# Patient Record
Sex: Female | Born: 1970
Health system: Southern US, Community
[De-identification: ages and names within clinical notes are randomized; demographics above are authoritative.]

## PROBLEM LIST (undated history)

## (undated) DIAGNOSIS — M1990 Unspecified osteoarthritis, unspecified site: Secondary | ICD-10-CM

## (undated) DIAGNOSIS — M549 Dorsalgia, unspecified: Secondary | ICD-10-CM

## (undated) DIAGNOSIS — I10 Essential (primary) hypertension: Secondary | ICD-10-CM

## (undated) DIAGNOSIS — B351 Tinea unguium: Secondary | ICD-10-CM

## (undated) HISTORY — DX: Dorsalgia, unspecified: M54.9

## (undated) HISTORY — PX: BUNIONECTOMY: SHX129

## (undated) HISTORY — PX: BACK SURGERY: SHX140

## (undated) HISTORY — DX: Tinea unguium: B35.1

## (undated) HISTORY — PX: TONSILLECTOMY: SUR1361

## (undated) HISTORY — PX: TUBAL LIGATION: SHX77

## (undated) HISTORY — PX: ABDOMINAL HYSTERECTOMY: SHX81

---

## 2012-11-17 ENCOUNTER — Encounter: Payer: Self-pay | Admitting: Podiatrist

## 2012-11-17 ENCOUNTER — Encounter (INDEPENDENT_AMBULATORY_CARE_PROVIDER_SITE_OTHER): Payer: Self-pay

## 2012-11-17 ENCOUNTER — Ambulatory Visit (INDEPENDENT_AMBULATORY_CARE_PROVIDER_SITE_OTHER): Payer: BC Managed Care – PPO | Admitting: Podiatrist

## 2012-11-17 VITALS — BP 147/89 | HR 78 | Resp 18

## 2012-11-17 DIAGNOSIS — B351 Tinea unguium: Secondary | ICD-10-CM

## 2012-11-17 MED ORDER — TERBINAFINE HCL 250 MG PO TABS: 250.0000 mg | ORAL_TABLET | Freq: Every day | ORAL | Status: DC

## 2012-11-17 NOTE — Progress Notes (Signed)
  Subjective:    Patient ID: Tammy [name], female    DOB: 1970/07/13, 42 y.o.   MRN: [id number]  HPI Patient presents today stating "My nails are thick and curling in and the right big toenail is brittle and has a split in it and the left big toenail is thick and curling in"  Patient relates she has had the problem for several years and more recently they have been hurting and discolored.  No treatment has been done on the nails.     Review of Systems  Constitutional: Negative.   HENT: Negative.   Eyes: Negative.   Respiratory: Negative.   Cardiovascular: Negative.   Gastrointestinal: Negative.   Endocrine: Negative.   Genitourinary: Negative.   Musculoskeletal: Positive for back pain and neck pain.  Skin: Negative.   Allergic/Immunologic: Negative.   Hematological:       Swollen lymph node  Psychiatric/Behavioral: Negative.        Objective:   Physical Exam GENERAL APPEARANCE: Alert, conversant. Appropriately groomed. No acute distress.  VASCULAR: Pedal pulses palpable and strong bilateral.  Capillary refill time is immediate to all digits,  Proximal to distal cooling it warm to warm.  Digital hair growth is present bilateral  NEUROLOGIC: sensation is intact epicritically and protectively to 5.07 monofilament at 5/5 sites bilateral.  Light touch is intact bilateral, vibratory sensation intact bilateral, achilles tendon reflex is intact bilateral.  MUSCULOSKELETAL: acceptable muscle strength, tone and stability bilateral.  Intrinsic muscluature intact bilateral.  Rectus appearance of foot and digits noted bilateral.   DERMATOLOGIC: skin color, texture, and turger are within normal limits.  Bilateral hallux nails are thickened and discolored.  The left hallux nail is incurvated on the medial border and has significant subungual debris noted.  Lesser toenails are also affected to a lesser degree.  Incision line from a previous foot (bunion) surgery noted right.      Assessment &  Plan:  Onychomycosis Plan:  Took a sample of the left hallux nail to ensure fungus is due to dermatophyte.  Wrote rx for Lamisil and gave forms for hepatic function panel.  Will notify patient result of the nail sample and blood test.

## 2012-11-17 NOTE — Patient Instructions (Addendum)
Onychomycosis/Fungal Toenails  WHAT IS IT? An infection that lies within the keratin of your nail plate that is caused by a fungus.  WHY ME? Fungal infections affect all ages, sexes, races, and creeds.  There may be many factors that predispose you to a fungal infection such as age, coexisting medical conditions such as diabetes, or an autoimmune disease; stress, medications, fatigue, genetics, etc.  Bottom line: fungus thrives in a warm, moist environment and your shoes offer such a location.  IS IT CONTAGIOUS? Theoretically, yes.  You do not want to share shoes, nail clippers or files with someone who has fungal toenails.  Walking around barefoot in the same room or sleeping in the same bed is unlikely to transfer the organism.  It is important to realize, however, that fungus can spread easily from one nail to the next on the same foot.  HOW DO WE TREAT THIS?  There are several ways to treat this condition.  Treatment may depend on many factors such as age, medications, pregnancy, liver and kidney conditions, etc.  It is best to ask your doctor which options are available to you.  1. No treatment.   Unlike many other medical concerns, you can live with this condition.  However for many people this can be a painful condition and may lead to ingrown toenails or a bacterial infection.  It is recommended that you keep the nails cut short to help reduce the amount of fungal nail. 2. Topical treatment.  These range from herbal remedies to prescription strength nail lacquers.  About 40-50% effective, topicals require twice daily application for approximately 9 to 12 months or until an entirely new nail has grown out.  The most effective topicals are medical grade medications available through physicians offices. 3. Oral antifungal medications.  With an 80-90% cure rate, the most common oral medication requires 3 to 4 months of therapy and stays in your system for a year as the new nail grows out.  Oral  antifungal medications do require blood work to make sure it is a safe drug for you.  A liver function panel will be performed prior to starting the medication and after the first month of treatment.  It is important to have the blood work performed to avoid any harmful side effects.  In general, this medication safe but blood work is required. 4. Laser Therapy.  This treatment is performed by applying a specialized laser to the affected nail plate.  This therapy is noninvasive, fast, and non-painful.  It is not covered by insurance and is therefore, out of pocket.  The results have been acceptable with a 50% cure rate.   [facility] is the only practice in the area to offer this therapy. 5. Permanent Nail Avulsion.  Removing the entire nail so that a new nail will not grow back.  ** We will contact you with the results of your nail culture to ensure you are on the best medication to treat the fungus  [facility] is the best place to get the Rx filled

## 2012-11-18 LAB — HEPATIC FUNCTION PANEL
Albumin: 4.3 g/dL (ref 3.5–5.5)
Alkaline Phosphatase: 67 IU/L (ref 39–117)
Bilirubin, Direct: 0.06 mg/dL (ref 0.00–0.40)
Total Bilirubin: <0.2 mg/dL (ref 0.0–1.2)
Total Protein: 6.6 g/dL (ref 6.0–8.5)

## 2012-11-19 ENCOUNTER — Telehealth: Payer: Self-pay | Admitting: *Deleted

## 2012-11-19 NOTE — Telephone Encounter (Signed)
Spoke to patient informed  her  that her bloodwork was great

## 2012-11-19 NOTE — Telephone Encounter (Signed)
Message copied by Bing Ree on Thu Nov 19, 2012  2:51 PM ------      Message from: Delories Heinz      Created: Wed Nov 18, 2012 10:19 AM       Please notify patient her blood work looks great.       ----- Message -----         From: [facility] Lab Results In Interface         Sent: 11/18/2012   7:46 AM           To: Marlowe Aschoff, DPM                   ------

## 2013-05-03 ENCOUNTER — Telehealth: Payer: Self-pay | Admitting: *Deleted

## 2013-05-03 NOTE — Telephone Encounter (Signed)
I had an appointment back in August.  My toenails have cleared up all except the big toes.  How long should it take the medicine to clear it up?  Should I come back in?  Do I need a refill on the medication?

## 2013-05-10 ENCOUNTER — Telehealth: Payer: Self-pay | Admitting: *Deleted

## 2013-05-10 NOTE — Telephone Encounter (Signed)
I called last week requesting a nurse to call me.  Please call me back at your convenience.

## 2013-05-11 NOTE — Telephone Encounter (Signed)
Called l/m on v/m to call back and schedule appt

## 2013-05-11 NOTE — Telephone Encounter (Signed)
Tried calling pt again still no answer left another voicemail to have her call to schedule an appointment

## 2013-05-12 NOTE — Telephone Encounter (Signed)
Sent message to melody and melody scheduled appt to see doctor/ lisa

## 2013-05-12 NOTE — Telephone Encounter (Signed)
We have tried multiple times to contact patient to schedule appt and no return call from patient yet/ lisa

## 2013-06-01 ENCOUNTER — Encounter: Payer: Self-pay | Admitting: Podiatrist

## 2013-06-01 ENCOUNTER — Ambulatory Visit (INDEPENDENT_AMBULATORY_CARE_PROVIDER_SITE_OTHER): Payer: BC Managed Care – PPO | Admitting: Podiatrist

## 2013-06-01 VITALS — BP 132/81 | HR 68 | Resp 18

## 2013-06-01 DIAGNOSIS — B351 Tinea unguium: Secondary | ICD-10-CM

## 2013-06-01 MED ORDER — ITRACONAZOLE 200 MG PO TABS: 1.0000 | ORAL_TABLET | Freq: Every day | ORAL | Status: DC

## 2013-06-01 NOTE — Patient Instructions (Addendum)
Itraconazole capsules and tablets What is this medicine? ITRACONAZOLE (i tra KO na zole) is an antifungal medicine. It is used to treat certain kinds of fungal or yeast infections. This medicine may be used for other purposes; ask your health care provider or pharmacist if you have questions. COMMON BRAND NAME(S): ONMEL, Sporanox What should I tell my health care provider before I take this medicine? They need to know if you have any of these conditions: -heart disease, including angina or heart failure -history of irregular heartbeat -kidney disease or on dialysis -liver disease -lung disease -an unusual or allergic reaction to itraconazole, or other antifungal medicines, foods, dyes or preservatives -pregnant or trying to get pregnant -breast-feeding How should I use this medicine? Take this medicine by mouth with a full Demirjian of water. Follow the directions on the prescription label. Take after eating a full meal. If you have a condition called achlorhydria, are taking H2-receptor antagonists or other gastric acid suppressors, you should take this medicine with a cola beverage. Take your doses at regular intervals. Do not take your medicine more often than directed. Do not stop taking except on your doctor's advice. Talk to your pediatrician regarding the use of this medicine in children. Special care may be needed. Overdosage: If you think you have taken too much of this medicine contact a poison control center or emergency room at once. NOTE: This medicine is only for you. Do not share this medicine with others. What if I miss a dose? If you miss a dose, take it as soon as you can. If it is almost time for your next dose, take only that dose. Do not take double or extra doses. What may interact with this medicine? Do not take this medicine with any of the following medications: -alfuzosin -cisapride -colchicine -ergot alkaloids like dihydroergotamine, ergonovine, ergotamine,  methylergonovine -methadone -nevirapine -pimozide -red yeast rice -sirolimus -some medicines for anxiety or sleep like alprazolam, clorazepate, flurazepam, midazolam, triazolam -some medicines for blood pressure like felodipine and nisoldipine -some medicines for cancer treatment like irinotecan -some medicines for cholesterol like atorvastatin, cerivastatin, lovastatin, simvastatin -some medicines for the heart like conivaptan, disopyramide, dofetilide, dronedarone, eplerenone, quinidine, ranolazine -some medicines for psychotic disturbances like lurasidone This medicine may also interact with the following medications: -alfentanil -antacids -antiviral medicines for HIV or AIDS -budesonide -buspirone -cilostazol -cyclosporine -digoxin -eletriptan -erythromycin -fentanyl -fluticasone -halofantrine -medicines for blood pressure like amlodipine and nifedipine -medicines for stomach problems like cimetidine, famotidine, omeprazole, lansoprazole -medicines for tuberculosis like isoniazid, INH, rifabutin, rifampin, rifapentine -medicines that treat or prevent blood clots like warfarin -methylprednisolone -other medicines for fungal infections -phenytoin, fosphenytoin -some medicines for diabetes -tacrolimus -trimetrexate This list may not describe all possible interactions. Give your health care provider a list of all the medicines, herbs, non-prescription drugs, or dietary supplements you use. Also tell them if you smoke, drink alcohol, or use illegal drugs. Some items may interact with your medicine. What should I watch for while using this medicine? Visit your doctor or health care professional for check ups. Tell your doctor or healthcare professional if your symptoms do not start to get better or if they get worse. Some fungal infections can take many weeks or months of treatment to cure. Avoid medicines for your stomach like antacids, anticholinergics, and acid blockers for at  least 2 hours after taking this medicine. You may get dizzy or blurred/double vision when taking this medicine. Do not drive, use machinery, or do anything that may be dangerous   until you know how the medicine affects you. Do not stand or sit up quickly, especially if you are an older patient. What side effects may I notice from receiving this medicine? Side effects that you should report to your doctor or health care professional as soon as possible: -allergic reactions like skin rash, itching or hives, swelling of the face, lips, or tongue -breathing problems -changes in hearing -cough up mucus -dark urine -fast, irregular heartbeat -general ill feeling or flu-like symptoms -light-colored stools -loss of appetite -nausea, vomiting -pain, tingling, numbness in the hands or feet -redness, blistering, peeling or loosening of the skin, including inside the mouth -right upper belly pain -sudden weight gain -swelling in feet, ankles, or legs -unusually weak or tired -yellowing of the eyes or skin Side effects that usually do not require medical attention (report to your doctor or health care professional if they continue or are bothersome): -blurred/double vision -diarrhea -dizziness -headache -stomach upset or bloating This list may not describe all possible side effects. Call your doctor for medical advice about side effects. You may report side effects to FDA at 1-800-FDA-1088. Where should I keep my medicine? Keep out of the reach of children. Store at room temperature between 15 and 25 degrees C (59 and 77 degrees F). Protect from light and moisture. Throw away any unused medicine after the expiration date. NOTE: This sheet is a summary. It may not cover all possible information. If you have questions about this medicine, talk to your doctor, pharmacist, or health care provider.  2014, Elsevier/Gold Standard. (2012-07-20 17:02:46)  

## 2013-06-01 NOTE — Progress Notes (Signed)
On my right foot is the big toe and the 2nd toe and the 5th and on the left it is the big toe  Subjective: Patient presents today for followup of Lamisil therapy. She states it cleared most the toenails however her big toe and her second toe on the right and the fifth toe on the left continue to be thick and discolored. She finished the Lamisil as instructed and had no problems or issues with the medication.  Objective: Continued appearance of mycotic nails are noted as stated above. Lamisil was not beneficial for these toenails.  Assessment: Onychomycosis  Plan: Recommended we switch her from Lamisil to onmel. A prescription was written for her to [facility]. At the completion of the medication she will call if it is not offer any clearing of the toenails.

## 2013-06-25 ENCOUNTER — Encounter: Payer: Self-pay | Admitting: Podiatrist

## 2015-07-31 DIAGNOSIS — B351 Tinea unguium: Secondary | ICD-10-CM | POA: Diagnosis not present

## 2015-07-31 DIAGNOSIS — F320 Major depressive disorder, single episode, mild: Secondary | ICD-10-CM | POA: Diagnosis not present

## 2015-07-31 DIAGNOSIS — M545 Low back pain: Secondary | ICD-10-CM | POA: Diagnosis not present

## 2015-08-28 DIAGNOSIS — M47816 Spondylosis without myelopathy or radiculopathy, lumbar region: Secondary | ICD-10-CM | POA: Diagnosis not present

## 2015-08-28 DIAGNOSIS — M5136 Other intervertebral disc degeneration, lumbar region: Secondary | ICD-10-CM | POA: Diagnosis not present

## 2015-09-18 DIAGNOSIS — M545 Low back pain: Secondary | ICD-10-CM | POA: Diagnosis not present

## 2015-10-30 DIAGNOSIS — N3001 Acute cystitis with hematuria: Secondary | ICD-10-CM | POA: Diagnosis not present

## 2015-10-30 DIAGNOSIS — N3000 Acute cystitis without hematuria: Secondary | ICD-10-CM | POA: Diagnosis not present

## 2015-11-13 DIAGNOSIS — N3000 Acute cystitis without hematuria: Secondary | ICD-10-CM | POA: Diagnosis not present

## 2016-02-13 DIAGNOSIS — M545 Low back pain: Secondary | ICD-10-CM | POA: Diagnosis not present

## 2016-02-19 DIAGNOSIS — M545 Low back pain: Secondary | ICD-10-CM | POA: Diagnosis not present

## 2016-02-19 DIAGNOSIS — M5136 Other intervertebral disc degeneration, lumbar region: Secondary | ICD-10-CM | POA: Diagnosis not present

## 2016-02-27 DIAGNOSIS — M545 Low back pain, unspecified: Secondary | ICD-10-CM | POA: Insufficient documentation

## 2016-02-27 DIAGNOSIS — M4126 Other idiopathic scoliosis, lumbar region: Secondary | ICD-10-CM | POA: Diagnosis not present

## 2016-02-27 DIAGNOSIS — M5126 Other intervertebral disc displacement, lumbar region: Secondary | ICD-10-CM

## 2016-02-27 DIAGNOSIS — M4310 Spondylolisthesis, site unspecified: Secondary | ICD-10-CM

## 2016-02-27 DIAGNOSIS — M4120 Other idiopathic scoliosis, site unspecified: Secondary | ICD-10-CM | POA: Insufficient documentation

## 2016-02-27 DIAGNOSIS — M51369 Other intervertebral disc degeneration, lumbar region without mention of lumbar back pain or lower extremity pain: Secondary | ICD-10-CM

## 2016-02-27 DIAGNOSIS — S32009K Unspecified fracture of unspecified lumbar vertebra, subsequent encounter for fracture with nonunion: Secondary | ICD-10-CM | POA: Insufficient documentation

## 2016-02-27 DIAGNOSIS — M5136 Other intervertebral disc degeneration, lumbar region: Secondary | ICD-10-CM | POA: Diagnosis not present

## 2016-02-27 HISTORY — DX: Other intervertebral disc displacement, lumbar region: M51.26

## 2016-02-27 HISTORY — DX: Other intervertebral disc degeneration, lumbar region: M51.36

## 2016-02-27 HISTORY — DX: Spondylolisthesis, site unspecified: M43.10

## 2016-02-27 HISTORY — DX: Low back pain, unspecified: M54.50

## 2016-02-27 HISTORY — DX: Other idiopathic scoliosis, site unspecified: M41.20

## 2016-02-27 HISTORY — DX: Other intervertebral disc degeneration, lumbar region without mention of lumbar back pain or lower extremity pain: M51.369

## 2016-02-29 DIAGNOSIS — Z0000 Encounter for general adult medical examination without abnormal findings: Secondary | ICD-10-CM | POA: Diagnosis not present

## 2016-03-06 DIAGNOSIS — R293 Abnormal posture: Secondary | ICD-10-CM | POA: Diagnosis not present

## 2016-03-06 DIAGNOSIS — M6281 Muscle weakness (generalized): Secondary | ICD-10-CM | POA: Diagnosis not present

## 2016-03-06 DIAGNOSIS — M545 Low back pain: Secondary | ICD-10-CM | POA: Diagnosis not present

## 2016-03-06 DIAGNOSIS — S39012D Strain of muscle, fascia and tendon of lower back, subsequent encounter: Secondary | ICD-10-CM | POA: Diagnosis not present

## 2016-03-08 DIAGNOSIS — M6281 Muscle weakness (generalized): Secondary | ICD-10-CM | POA: Diagnosis not present

## 2016-03-08 DIAGNOSIS — S39012D Strain of muscle, fascia and tendon of lower back, subsequent encounter: Secondary | ICD-10-CM | POA: Diagnosis not present

## 2016-03-08 DIAGNOSIS — R293 Abnormal posture: Secondary | ICD-10-CM | POA: Diagnosis not present

## 2016-03-08 DIAGNOSIS — M545 Low back pain: Secondary | ICD-10-CM | POA: Diagnosis not present

## 2016-03-11 DIAGNOSIS — R293 Abnormal posture: Secondary | ICD-10-CM | POA: Diagnosis not present

## 2016-03-11 DIAGNOSIS — M6281 Muscle weakness (generalized): Secondary | ICD-10-CM | POA: Diagnosis not present

## 2016-03-11 DIAGNOSIS — M545 Low back pain: Secondary | ICD-10-CM | POA: Diagnosis not present

## 2016-03-11 DIAGNOSIS — S39012D Strain of muscle, fascia and tendon of lower back, subsequent encounter: Secondary | ICD-10-CM | POA: Diagnosis not present

## 2016-03-21 DIAGNOSIS — M6281 Muscle weakness (generalized): Secondary | ICD-10-CM | POA: Diagnosis not present

## 2016-03-21 DIAGNOSIS — S39012D Strain of muscle, fascia and tendon of lower back, subsequent encounter: Secondary | ICD-10-CM | POA: Diagnosis not present

## 2016-03-21 DIAGNOSIS — M545 Low back pain: Secondary | ICD-10-CM | POA: Diagnosis not present

## 2016-03-21 DIAGNOSIS — R293 Abnormal posture: Secondary | ICD-10-CM | POA: Diagnosis not present

## 2016-03-26 DIAGNOSIS — I10 Essential (primary) hypertension: Secondary | ICD-10-CM | POA: Diagnosis not present

## 2016-03-26 DIAGNOSIS — M4126 Other idiopathic scoliosis, lumbar region: Secondary | ICD-10-CM | POA: Diagnosis not present

## 2016-03-26 DIAGNOSIS — M4310 Spondylolisthesis, site unspecified: Secondary | ICD-10-CM | POA: Diagnosis not present

## 2016-03-27 DIAGNOSIS — R293 Abnormal posture: Secondary | ICD-10-CM | POA: Diagnosis not present

## 2016-03-27 DIAGNOSIS — M545 Low back pain: Secondary | ICD-10-CM | POA: Diagnosis not present

## 2016-03-27 DIAGNOSIS — S39012D Strain of muscle, fascia and tendon of lower back, subsequent encounter: Secondary | ICD-10-CM | POA: Diagnosis not present

## 2016-03-27 DIAGNOSIS — M6281 Muscle weakness (generalized): Secondary | ICD-10-CM | POA: Diagnosis not present

## 2016-04-02 DIAGNOSIS — M545 Low back pain: Secondary | ICD-10-CM | POA: Diagnosis not present

## 2016-04-02 DIAGNOSIS — M6281 Muscle weakness (generalized): Secondary | ICD-10-CM | POA: Diagnosis not present

## 2016-04-02 DIAGNOSIS — S39012D Strain of muscle, fascia and tendon of lower back, subsequent encounter: Secondary | ICD-10-CM | POA: Diagnosis not present

## 2016-04-02 DIAGNOSIS — R293 Abnormal posture: Secondary | ICD-10-CM | POA: Diagnosis not present

## 2016-04-08 DIAGNOSIS — S39012D Strain of muscle, fascia and tendon of lower back, subsequent encounter: Secondary | ICD-10-CM | POA: Diagnosis not present

## 2016-04-08 DIAGNOSIS — M6281 Muscle weakness (generalized): Secondary | ICD-10-CM | POA: Diagnosis not present

## 2016-04-08 DIAGNOSIS — M545 Low back pain: Secondary | ICD-10-CM | POA: Diagnosis not present

## 2016-04-08 DIAGNOSIS — R293 Abnormal posture: Secondary | ICD-10-CM | POA: Diagnosis not present

## 2016-04-15 DIAGNOSIS — R293 Abnormal posture: Secondary | ICD-10-CM | POA: Diagnosis not present

## 2016-04-15 DIAGNOSIS — S39012D Strain of muscle, fascia and tendon of lower back, subsequent encounter: Secondary | ICD-10-CM | POA: Diagnosis not present

## 2016-04-15 DIAGNOSIS — M6281 Muscle weakness (generalized): Secondary | ICD-10-CM | POA: Diagnosis not present

## 2016-04-15 DIAGNOSIS — M545 Low back pain: Secondary | ICD-10-CM | POA: Diagnosis not present

## 2016-04-18 DIAGNOSIS — M6281 Muscle weakness (generalized): Secondary | ICD-10-CM | POA: Diagnosis not present

## 2016-04-18 DIAGNOSIS — M545 Low back pain: Secondary | ICD-10-CM | POA: Diagnosis not present

## 2016-04-18 DIAGNOSIS — R293 Abnormal posture: Secondary | ICD-10-CM | POA: Diagnosis not present

## 2016-04-18 DIAGNOSIS — S39012D Strain of muscle, fascia and tendon of lower back, subsequent encounter: Secondary | ICD-10-CM | POA: Diagnosis not present

## 2016-04-22 DIAGNOSIS — N3001 Acute cystitis with hematuria: Secondary | ICD-10-CM | POA: Diagnosis not present

## 2016-04-22 DIAGNOSIS — N3091 Cystitis, unspecified with hematuria: Secondary | ICD-10-CM | POA: Diagnosis not present

## 2016-05-07 ENCOUNTER — Other Ambulatory Visit: Payer: Self-pay | Admitting: Neurological Surgery

## 2016-05-27 DIAGNOSIS — Z1231 Encounter for screening mammogram for malignant neoplasm of breast: Secondary | ICD-10-CM | POA: Diagnosis not present

## 2016-06-03 DIAGNOSIS — L255 Unspecified contact dermatitis due to plants, except food: Secondary | ICD-10-CM | POA: Diagnosis not present

## 2016-06-27 ENCOUNTER — Encounter (HOSPITAL_COMMUNITY): Payer: Self-pay

## 2016-06-27 ENCOUNTER — Ambulatory Visit (HOSPITAL_COMMUNITY)
Admission: RE | Admit: 2016-06-27 | Discharge: 2016-06-27 | Disposition: A | Discharge status: Home / Self Care | Payer: BLUE CROSS/BLUE SHIELD | Source: Ambulatory Visit | Attending: Neurological Surgery | Admitting: Neurological Surgery

## 2016-06-27 ENCOUNTER — Encounter (HOSPITAL_COMMUNITY)
Admission: RE | Admit: 2016-06-27 | Discharge: 2016-06-27 | Disposition: A | Discharge status: Home / Self Care | Payer: BLUE CROSS/BLUE SHIELD | Source: Ambulatory Visit | Attending: Neurological Surgery | Admitting: Neurological Surgery

## 2016-06-27 DIAGNOSIS — M419 Scoliosis, unspecified: Secondary | ICD-10-CM | POA: Insufficient documentation

## 2016-06-27 DIAGNOSIS — Z0183 Encounter for blood typing: Secondary | ICD-10-CM | POA: Insufficient documentation

## 2016-06-27 DIAGNOSIS — Z01812 Encounter for preprocedural laboratory examination: Secondary | ICD-10-CM | POA: Diagnosis not present

## 2016-06-27 DIAGNOSIS — Z01818 Encounter for other preprocedural examination: Secondary | ICD-10-CM | POA: Diagnosis not present

## 2016-06-27 HISTORY — DX: Unspecified osteoarthritis, unspecified site: M19.90

## 2016-06-27 LAB — CBC WITH DIFFERENTIAL/PLATELET
BASOS ABS: 0 10*3/uL (ref 0.0–0.1)
Basophils Relative: 0 %
EOS PCT: 1 %
Eosinophils Absolute: 0.2 10*3/uL (ref 0.0–0.7)
HEMATOCRIT: 43.9 % (ref 36.0–46.0)
HEMOGLOBIN: 14.5 g/dL (ref 12.0–15.0)
LYMPHS ABS: 2.9 10*3/uL (ref 0.7–4.0)
LYMPHS PCT: 26 %
MCH: 31.6 pg (ref 26.0–34.0)
MCHC: 33 g/dL (ref 30.0–36.0)
MCV: 95.6 fL (ref 78.0–100.0)
Monocytes Absolute: 0.7 10*3/uL (ref 0.1–1.0)
Monocytes Relative: 6 %
NEUTROS ABS: 7.2 10*3/uL (ref 1.7–7.7)
NEUTROS PCT: 67 %
PLATELETS: 260 10*3/uL (ref 150–400)
RBC: 4.59 MIL/uL (ref 3.87–5.11)
RDW: 13.7 % (ref 11.5–15.5)
WBC: 10.9 10*3/uL — AB (ref 4.0–10.5)

## 2016-06-27 LAB — SURGICAL PCR SCREEN
MRSA, PCR: NEGATIVE
STAPHYLOCOCCUS AUREUS: NEGATIVE

## 2016-06-27 LAB — BASIC METABOLIC PANEL
ANION GAP: 9 (ref 5–15)
BUN: 10 mg/dL (ref 6–20)
CHLORIDE: 103 mmol/L (ref 101–111)
CO2: 26 mmol/L (ref 22–32)
Calcium: 9.4 mg/dL (ref 8.9–10.3)
Creatinine, Ser: 0.81 mg/dL (ref 0.44–1.00)
Glucose, Bld: 78 mg/dL (ref 65–99)
POTASSIUM: 4.1 mmol/L (ref 3.5–5.1)
SODIUM: 138 mmol/L (ref 135–145)

## 2016-06-27 LAB — TYPE AND SCREEN
ABO/RH(D): O POS
ANTIBODY SCREEN: NEGATIVE

## 2016-06-27 LAB — PROTIME-INR
INR: 0.92
Prothrombin Time: 12.3 seconds (ref 11.4–15.2)

## 2016-06-27 LAB — ABO/RH: ABO/RH(D): O POS

## 2016-06-27 NOTE — Pre-Procedure Instructions (Signed)
    Germaine Pomfreticole Hannon  06/27/2016      [id number] - [place], [place] - [address] [facility] [place] [id number] Phone: [phone] Fax: [phone]  [facility] [place] - [address] [place] [id number] Phone: [phone] Fax: [phone]    Your procedure is scheduled on June 20th, Wednesday.   [facility] 7:45 AM.             (posted surgery time 9:45 am - 2:45 pm)   Call this number if you have problems the morning of surgery:  [phone], any other questions [phone] Mon-Fri from 8-4:30pm   Remember:  Do not eat food or drink liquids after midnight Tuesday.              4-5 days prior to surgery, STOP TAKING any vitamins, anti-inflammatories, herbal supplements.   Take these medicines the morning of surgery with A SIP OF WATER : Flexeril, Oxycodone.   Do not wear jewelry, make-up or nail polish.  Do not wear lotions, powders,  perfumes, or deoderant.  Do not shave 48 hours prior to surgery.     Do not bring valuables to the hospital.  [facility] is not responsible for any belongings or valuables.  Contacts, dentures or bridgework may not be worn into surgery.  Leave your suitcase in the car.  After surgery it may be brought to your room.  For patients admitted to the hospital, discharge time will be determined by your treatment team.   Please read over the following fact sheets that you were given. Pain Booklet, MRSA Information and Surgical Site Infection Prevention

## 2016-06-27 NOTE — Progress Notes (Addendum)
PCP is Gerre PebblesSally Davis, NP or PA with [facility] [place].  LOV 01/2016 Denies any cardiac issues or tests.

## 2016-07-09 MED ORDER — CEFAZOLIN SODIUM-DEXTROSE 2-4 GM/100ML-% IV SOLN
2.0000 g | INTRAVENOUS | Status: AC
  Administered 2016-07-10 (×2): 2 g via INTRAVENOUS
  Filled 2016-07-09: qty 100

## 2016-07-09 MED ORDER — DEXAMETHASONE SODIUM PHOSPHATE 10 MG/ML IJ SOLN
10.0000 mg | INTRAMUSCULAR | Status: AC
  Administered 2016-07-10: 10 mg via INTRAVENOUS
  Filled 2016-07-09: qty 1

## 2016-07-10 ENCOUNTER — Inpatient Hospital Stay (HOSPITAL_COMMUNITY)
Admission: RE | Admit: 2016-07-10 | Discharge: 2016-07-11 | DRG: 455 | Disposition: A | Discharge status: Home / Self Care | Payer: BLUE CROSS/BLUE SHIELD | Source: Ambulatory Visit | Attending: Neurological Surgery | Admitting: Neurological Surgery

## 2016-07-10 ENCOUNTER — Encounter (HOSPITAL_COMMUNITY)
Admission: RE | Disposition: A | Discharge status: Home / Self Care | Payer: Self-pay | Source: Ambulatory Visit | Attending: Neurological Surgery

## 2016-07-10 ENCOUNTER — Inpatient Hospital Stay (HOSPITAL_COMMUNITY): Payer: BLUE CROSS/BLUE SHIELD | Admitting: Certified Registered"

## 2016-07-10 ENCOUNTER — Encounter (HOSPITAL_COMMUNITY): Payer: Self-pay | Admitting: Anesthesiology

## 2016-07-10 ENCOUNTER — Inpatient Hospital Stay (HOSPITAL_COMMUNITY): Payer: BLUE CROSS/BLUE SHIELD

## 2016-07-10 DIAGNOSIS — Z981 Arthrodesis status: Secondary | ICD-10-CM

## 2016-07-10 DIAGNOSIS — M48061 Spinal stenosis, lumbar region without neurogenic claudication: Secondary | ICD-10-CM | POA: Diagnosis present

## 2016-07-10 DIAGNOSIS — Z419 Encounter for procedure for purposes other than remedying health state, unspecified: Secondary | ICD-10-CM

## 2016-07-10 DIAGNOSIS — M47816 Spondylosis without myelopathy or radiculopathy, lumbar region: Principal | ICD-10-CM | POA: Diagnosis present

## 2016-07-10 DIAGNOSIS — F17210 Nicotine dependence, cigarettes, uncomplicated: Secondary | ICD-10-CM | POA: Diagnosis present

## 2016-07-10 DIAGNOSIS — M1990 Unspecified osteoarthritis, unspecified site: Secondary | ICD-10-CM | POA: Diagnosis present

## 2016-07-10 DIAGNOSIS — M419 Scoliosis, unspecified: Secondary | ICD-10-CM | POA: Diagnosis not present

## 2016-07-10 DIAGNOSIS — M545 Low back pain: Secondary | ICD-10-CM | POA: Diagnosis not present

## 2016-07-10 DIAGNOSIS — Z90710 Acquired absence of both cervix and uterus: Secondary | ICD-10-CM | POA: Diagnosis not present

## 2016-07-10 DIAGNOSIS — M4316 Spondylolisthesis, lumbar region: Secondary | ICD-10-CM | POA: Diagnosis not present

## 2016-07-10 DIAGNOSIS — M4326 Fusion of spine, lumbar region: Secondary | ICD-10-CM | POA: Diagnosis not present

## 2016-07-10 HISTORY — PX: MAXIMUM ACCESS (MAS)POSTERIOR LUMBAR INTERBODY FUSION (PLIF) 3 LEVEL: SHX6370

## 2016-07-10 HISTORY — DX: Arthrodesis status: Z98.1

## 2016-07-10 SURGERY — FOR MAXIMUM ACCESS (MAS) POSTERIOR LUMBAR INTERBODY FUSION (PLIF) 3 LEVEL
Anesthesia: General | Site: Back

## 2016-07-10 MED ORDER — FENTANYL CITRATE (PF) 100 MCG/2ML IJ SOLN
INTRAMUSCULAR | Status: DC | PRN
  Administered 2016-07-10 (×3): 50 ug via INTRAVENOUS
  Administered 2016-07-10: 150 ug via INTRAVENOUS
  Administered 2016-07-10 (×2): 50 ug via INTRAVENOUS
  Administered 2016-07-10: 100 ug via INTRAVENOUS

## 2016-07-10 MED ORDER — MEPERIDINE HCL 25 MG/ML IJ SOLN: 6.2500 mg | INTRAMUSCULAR | Status: DC | PRN

## 2016-07-10 MED ORDER — PHENOL 1.4 % MT LIQD: 1.0000 | OROMUCOSAL | Status: DC | PRN

## 2016-07-10 MED ORDER — THROMBIN 20000 UNITS EX SOLR
CUTANEOUS | Status: AC
  Filled 2016-07-10: qty 20000

## 2016-07-10 MED ORDER — ACETAMINOPHEN 325 MG PO TABS
ORAL_TABLET | ORAL | Status: AC
  Administered 2016-07-10: 650 mg via ORAL
  Filled 2016-07-10: qty 2

## 2016-07-10 MED ORDER — THROMBIN 5000 UNITS EX SOLR
CUTANEOUS | Status: AC
  Filled 2016-07-10: qty 5000

## 2016-07-10 MED ORDER — MORPHINE SULFATE (PF) 2 MG/ML IV SOLN: 2.0000 mg | INTRAVENOUS | Status: DC | PRN

## 2016-07-10 MED ORDER — MIDAZOLAM HCL 2 MG/2ML IJ SOLN
INTRAMUSCULAR | Status: AC
  Filled 2016-07-10: qty 2

## 2016-07-10 MED ORDER — LACTATED RINGERS IV SOLN
INTRAVENOUS | Status: DC | PRN
  Administered 2016-07-10 (×3): via INTRAVENOUS

## 2016-07-10 MED ORDER — OXYCODONE HCL 5 MG PO TABS
ORAL_TABLET | ORAL | Status: AC
Start: 2016-07-10 — End: 2016-07-10
  Administered 2016-07-10: 10 mg via ORAL
  Filled 2016-07-10: qty 2

## 2016-07-10 MED ORDER — BUPIVACAINE HCL (PF) 0.25 % IJ SOLN
INTRAMUSCULAR | Status: AC
  Filled 2016-07-10: qty 30

## 2016-07-10 MED ORDER — OXYCODONE HCL 5 MG PO TABS
5.0000 mg | ORAL_TABLET | ORAL | Status: DC | PRN
  Administered 2016-07-10 – 2016-07-11 (×4): 10 mg via ORAL
  Filled 2016-07-10 (×4): qty 2

## 2016-07-10 MED ORDER — PROPOFOL 10 MG/ML IV BOLUS
INTRAVENOUS | Status: AC
  Filled 2016-07-10: qty 20

## 2016-07-10 MED ORDER — KETOROLAC TROMETHAMINE 30 MG/ML IJ SOLN
INTRAMUSCULAR | Status: DC | PRN
  Administered 2016-07-10: 30 mg via INTRAVENOUS

## 2016-07-10 MED ORDER — PROMETHAZINE HCL 25 MG/ML IJ SOLN: 6.2500 mg | INTRAMUSCULAR | Status: DC | PRN

## 2016-07-10 MED ORDER — SUCCINYLCHOLINE CHLORIDE 200 MG/10ML IV SOSY
PREFILLED_SYRINGE | INTRAVENOUS | Status: DC | PRN
  Administered 2016-07-10: 120 mg via INTRAVENOUS

## 2016-07-10 MED ORDER — CHLORHEXIDINE GLUCONATE CLOTH 2 % EX PADS: 6.0000 | MEDICATED_PAD | Freq: Once | CUTANEOUS | Status: DC

## 2016-07-10 MED ORDER — ONDANSETRON HCL 4 MG/2ML IJ SOLN
INTRAMUSCULAR | Status: AC
  Filled 2016-07-10: qty 2

## 2016-07-10 MED ORDER — FENTANYL CITRATE (PF) 250 MCG/5ML IJ SOLN
INTRAMUSCULAR | Status: AC
  Filled 2016-07-10: qty 5

## 2016-07-10 MED ORDER — SODIUM CHLORIDE 0.9% FLUSH: 3.0000 mL | INTRAVENOUS | Status: DC | PRN

## 2016-07-10 MED ORDER — DEXTROSE 5 % IV SOLN
INTRAVENOUS | Status: DC | PRN
  Administered 2016-07-10: 15 ug/min via INTRAVENOUS

## 2016-07-10 MED ORDER — SODIUM CHLORIDE 0.9% FLUSH
3.0000 mL | Freq: Two times a day (BID) | INTRAVENOUS | Status: DC
  Administered 2016-07-10: 3 mL via INTRAVENOUS

## 2016-07-10 MED ORDER — THROMBIN 5000 UNITS EX SOLR
CUTANEOUS | Status: DC | PRN
  Administered 2016-07-10: 10:00:00 via TOPICAL

## 2016-07-10 MED ORDER — ONDANSETRON HCL 4 MG/2ML IJ SOLN
INTRAMUSCULAR | Status: DC | PRN
  Administered 2016-07-10: 4 mg via INTRAVENOUS

## 2016-07-10 MED ORDER — HYDROMORPHONE HCL 1 MG/ML IJ SOLN
INTRAMUSCULAR | Status: AC
  Administered 2016-07-10: 0.5 mg via INTRAVENOUS
  Filled 2016-07-10: qty 1

## 2016-07-10 MED ORDER — MIDAZOLAM HCL 2 MG/2ML IJ SOLN
INTRAMUSCULAR | Status: DC | PRN
  Administered 2016-07-10: 2 mg via INTRAVENOUS

## 2016-07-10 MED ORDER — CEFAZOLIN SODIUM 1 G IJ SOLR
INTRAMUSCULAR | Status: AC
  Filled 2016-07-10: qty 20

## 2016-07-10 MED ORDER — MENTHOL 3 MG MT LOZG: 1.0000 | LOZENGE | OROMUCOSAL | Status: DC | PRN

## 2016-07-10 MED ORDER — VANCOMYCIN HCL 1000 MG IV SOLR
INTRAVENOUS | Status: DC | PRN
  Administered 2016-07-10: 1000 mg

## 2016-07-10 MED ORDER — ONDANSETRON HCL 4 MG/2ML IJ SOLN: 4.0000 mg | Freq: Four times a day (QID) | INTRAMUSCULAR | Status: DC | PRN

## 2016-07-10 MED ORDER — SENNA 8.6 MG PO TABS
1.0000 | ORAL_TABLET | Freq: Two times a day (BID) | ORAL | Status: DC
Start: 2016-07-10 — End: 2016-07-11
  Administered 2016-07-10 – 2016-07-11 (×2): 8.6 mg via ORAL
  Filled 2016-07-10 (×2): qty 1

## 2016-07-10 MED ORDER — 0.9 % SODIUM CHLORIDE (POUR BTL) OPTIME
TOPICAL | Status: DC | PRN
  Administered 2016-07-10: 1000 mL

## 2016-07-10 MED ORDER — CELECOXIB 200 MG PO CAPS
200.0000 mg | ORAL_CAPSULE | Freq: Two times a day (BID) | ORAL | Status: DC
  Administered 2016-07-10 – 2016-07-11 (×2): 200 mg via ORAL
  Filled 2016-07-10 (×2): qty 1

## 2016-07-10 MED ORDER — ONDANSETRON HCL 4 MG PO TABS: 4.0000 mg | ORAL_TABLET | Freq: Four times a day (QID) | ORAL | Status: DC | PRN

## 2016-07-10 MED ORDER — SODIUM CHLORIDE 0.9 % IV SOLN: 250.0000 mL | INTRAVENOUS | Status: DC

## 2016-07-10 MED ORDER — METHOCARBAMOL 1000 MG/10ML IJ SOLN
500.0000 mg | Freq: Four times a day (QID) | INTRAVENOUS | Status: DC | PRN
  Filled 2016-07-10: qty 5

## 2016-07-10 MED ORDER — LACTATED RINGERS IV SOLN: INTRAVENOUS | Status: DC

## 2016-07-10 MED ORDER — EPHEDRINE 5 MG/ML INJ
INTRAVENOUS | Status: AC
  Filled 2016-07-10: qty 10

## 2016-07-10 MED ORDER — LIDOCAINE 2% (20 MG/ML) 5 ML SYRINGE
INTRAMUSCULAR | Status: AC
  Filled 2016-07-10: qty 5

## 2016-07-10 MED ORDER — METHOCARBAMOL 500 MG PO TABS
ORAL_TABLET | ORAL | Status: AC
  Administered 2016-07-10: 500 mg via ORAL
  Filled 2016-07-10: qty 1

## 2016-07-10 MED ORDER — MORPHINE SULFATE (PF) 4 MG/ML IV SOLN
INTRAVENOUS | Status: AC
Start: 2016-07-10 — End: 2016-07-10
  Administered 2016-07-10: 2 mg
  Filled 2016-07-10: qty 1

## 2016-07-10 MED ORDER — SODIUM CHLORIDE 0.9 % IR SOLN
Status: DC | PRN
  Administered 2016-07-10: 10:00:00

## 2016-07-10 MED ORDER — VANCOMYCIN HCL 1000 MG IV SOLR
INTRAVENOUS | Status: AC
  Filled 2016-07-10: qty 1000

## 2016-07-10 MED ORDER — PROPOFOL 10 MG/ML IV BOLUS
INTRAVENOUS | Status: DC | PRN
  Administered 2016-07-10: 150 mg via INTRAVENOUS

## 2016-07-10 MED ORDER — HYDROMORPHONE HCL 1 MG/ML IJ SOLN
INTRAMUSCULAR | Status: AC
  Filled 2016-07-10: qty 0.5

## 2016-07-10 MED ORDER — POTASSIUM CHLORIDE IN NACL 20-0.9 MEQ/L-% IV SOLN
INTRAVENOUS | Status: DC
  Administered 2016-07-10: 17:00:00 via INTRAVENOUS
  Filled 2016-07-10: qty 1000

## 2016-07-10 MED ORDER — ACETAMINOPHEN 650 MG RE SUPP: 650.0000 mg | RECTAL | Status: DC | PRN

## 2016-07-10 MED ORDER — BUPIVACAINE HCL (PF) 0.25 % IJ SOLN
INTRAMUSCULAR | Status: DC | PRN
  Administered 2016-07-10: 6 mL

## 2016-07-10 MED ORDER — MORPHINE SULFATE (PF) 4 MG/ML IV SOLN
2.0000 mg | INTRAVENOUS | Status: DC | PRN
  Administered 2016-07-10 – 2016-07-11 (×2): 2 mg via INTRAVENOUS
  Filled 2016-07-10 (×2): qty 1

## 2016-07-10 MED ORDER — METHOCARBAMOL 500 MG PO TABS
500.0000 mg | ORAL_TABLET | Freq: Four times a day (QID) | ORAL | Status: DC | PRN
  Administered 2016-07-10 – 2016-07-11 (×3): 500 mg via ORAL
  Filled 2016-07-10 (×2): qty 1

## 2016-07-10 MED ORDER — HYDROMORPHONE HCL 1 MG/ML IJ SOLN
0.2500 mg | INTRAMUSCULAR | Status: DC | PRN
  Administered 2016-07-10 (×3): 0.5 mg via INTRAVENOUS

## 2016-07-10 MED ORDER — THROMBIN 20000 UNITS EX SOLR
CUTANEOUS | Status: DC | PRN
  Administered 2016-07-10: 10:00:00 via TOPICAL

## 2016-07-10 MED ORDER — ACETAMINOPHEN 325 MG PO TABS
650.0000 mg | ORAL_TABLET | ORAL | Status: DC | PRN
  Administered 2016-07-10 – 2016-07-11 (×2): 650 mg via ORAL
  Filled 2016-07-10: qty 2

## 2016-07-10 MED ORDER — LIDOCAINE 2% (20 MG/ML) 5 ML SYRINGE
INTRAMUSCULAR | Status: DC | PRN
  Administered 2016-07-10: 60 mg via INTRAVENOUS

## 2016-07-10 MED ORDER — CEFAZOLIN SODIUM-DEXTROSE 2-4 GM/100ML-% IV SOLN
2.0000 g | Freq: Three times a day (TID) | INTRAVENOUS | Status: AC
  Administered 2016-07-10: 2 g via INTRAVENOUS
  Filled 2016-07-10 (×2): qty 100

## 2016-07-10 MED ORDER — PROPOFOL 500 MG/50ML IV EMUL
INTRAVENOUS | Status: DC | PRN
  Administered 2016-07-10: 50 ug/kg/min via INTRAVENOUS

## 2016-07-10 MED ORDER — EPHEDRINE SULFATE-NACL 50-0.9 MG/10ML-% IV SOSY
PREFILLED_SYRINGE | INTRAVENOUS | Status: DC | PRN
  Administered 2016-07-10 (×2): 10 mg via INTRAVENOUS

## 2016-07-10 SURGICAL SUPPLY — 71 items
BAG DECANTER FOR FLEXI CONT (MISCELLANEOUS) ×3 IMPLANT
BASKET BONE COLLECTION (BASKET) ×3 IMPLANT
BENZOIN TINCTURE PRP APPL 2/3 (GAUZE/BANDAGES/DRESSINGS) ×3 IMPLANT
BLADE CLIPPER SURG (BLADE) IMPLANT
BUR MATCHSTICK NEURO 3.0 LAGG (BURR) ×3 IMPLANT
CAGE COROENT MP 8X23 (Cage) ×6 IMPLANT
CAGE COROENT MP 8X9X23M-8 SPIN (Cage) ×12 IMPLANT
CANISTER SUCT 3000ML PPV (MISCELLANEOUS) ×3 IMPLANT
CAP RELINE MOD TULIP RMM (Cap) ×6 IMPLANT
CARTRIDGE OIL MAESTRO DRILL (MISCELLANEOUS) ×1 IMPLANT
CLIP NEUROVISION LG (CLIP) ×3 IMPLANT
CLOSURE WOUND 1/2 X4 (GAUZE/BANDAGES/DRESSINGS) ×1
CONT SPEC 4OZ CLIKSEAL STRL BL (MISCELLANEOUS) ×3 IMPLANT
COVER BACK TABLE 24X17X13 BIG (DRAPES) IMPLANT
COVER BACK TABLE 60X90IN (DRAPES) ×3 IMPLANT
DERMABOND ADVANCED (GAUZE/BANDAGES/DRESSINGS) ×2
DERMABOND ADVANCED .7 DNX12 (GAUZE/BANDAGES/DRESSINGS) ×1 IMPLANT
DIFFUSER DRILL AIR PNEUMATIC (MISCELLANEOUS) ×3 IMPLANT
DRAPE C-ARM 42X72 X-RAY (DRAPES) ×3 IMPLANT
DRAPE C-ARMOR (DRAPES) ×3 IMPLANT
DRAPE LAPAROTOMY 100X72X124 (DRAPES) ×3 IMPLANT
DRAPE POUCH INSTRU U-SHP 10X18 (DRAPES) ×3 IMPLANT
DRAPE SURG 17X23 STRL (DRAPES) ×3 IMPLANT
DRSG OPSITE POSTOP 4X8 (GAUZE/BANDAGES/DRESSINGS) ×3 IMPLANT
DURAPREP 26ML APPLICATOR (WOUND CARE) ×3 IMPLANT
ELECT REM PT RETURN 9FT ADLT (ELECTROSURGICAL) ×3
ELECTRODE REM PT RTRN 9FT ADLT (ELECTROSURGICAL) ×1 IMPLANT
EVACUATOR 1/8 PVC DRAIN (DRAIN) ×3 IMPLANT
GAUZE SPONGE 4X4 16PLY XRAY LF (GAUZE/BANDAGES/DRESSINGS) IMPLANT
GLOVE BIO SURGEON STRL SZ7 (GLOVE) IMPLANT
GLOVE BIO SURGEON STRL SZ7.5 (GLOVE) ×3 IMPLANT
GLOVE BIO SURGEON STRL SZ8 (GLOVE) ×6 IMPLANT
GLOVE BIOGEL PI IND STRL 6.5 (GLOVE) ×2 IMPLANT
GLOVE BIOGEL PI IND STRL 7.0 (GLOVE) IMPLANT
GLOVE BIOGEL PI IND STRL 7.5 (GLOVE) ×1 IMPLANT
GLOVE BIOGEL PI INDICATOR 6.5 (GLOVE) ×4
GLOVE BIOGEL PI INDICATOR 7.0 (GLOVE)
GLOVE BIOGEL PI INDICATOR 7.5 (GLOVE) ×2
GLOVE SURG SS PI 6.5 STRL IVOR (GLOVE) ×9 IMPLANT
GOWN STRL REUS W/ TWL LRG LVL3 (GOWN DISPOSABLE) ×2 IMPLANT
GOWN STRL REUS W/ TWL XL LVL3 (GOWN DISPOSABLE) ×2 IMPLANT
GOWN STRL REUS W/TWL 2XL LVL3 (GOWN DISPOSABLE) IMPLANT
GOWN STRL REUS W/TWL LRG LVL3 (GOWN DISPOSABLE) ×4
GOWN STRL REUS W/TWL XL LVL3 (GOWN DISPOSABLE) ×4
HEMOSTAT POWDER KIT SURGIFOAM (HEMOSTASIS) ×3 IMPLANT
KIT BASIN OR (CUSTOM PROCEDURE TRAY) ×3 IMPLANT
KIT ROOM TURNOVER OR (KITS) ×3 IMPLANT
MILL MEDIUM DISP (BLADE) ×3 IMPLANT
MODULE NVM5 NEXT GEN EMG (NEEDLE) ×3 IMPLANT
NEEDLE HYPO 25X1 1.5 SAFETY (NEEDLE) ×3 IMPLANT
NS IRRIG 1000ML POUR BTL (IV SOLUTION) ×3 IMPLANT
OIL CARTRIDGE MAESTRO DRILL (MISCELLANEOUS) ×3
PACK LAMINECTOMY NEURO (CUSTOM PROCEDURE TRAY) ×3 IMPLANT
PAD ARMBOARD 7.5X6 YLW CONV (MISCELLANEOUS) ×9 IMPLANT
ROD RELINE O COCR 5.0X110MM (Rod) ×6 IMPLANT
SCREW LOCK RSS 4.5/5.0MM (Screw) ×24 IMPLANT
SCREW RELINE RMM 5.5X35 4S (Screw) ×18 IMPLANT
SCREW SHANK RELINE MOD 5.0X35 (Screw) ×6 IMPLANT
SPONGE LAP 4X18 X RAY DECT (DISPOSABLE) IMPLANT
SPONGE SURGIFOAM ABS GEL 100 (HEMOSTASIS) ×3 IMPLANT
STRIP BIOACTIVE 20CC 25X100X8 (Miscellaneous) ×3 IMPLANT
STRIP CLOSURE SKIN 1/2X4 (GAUZE/BANDAGES/DRESSINGS) ×2 IMPLANT
SUT VIC AB 0 CT1 18XCR BRD8 (SUTURE) ×1 IMPLANT
SUT VIC AB 0 CT1 8-18 (SUTURE) ×2
SUT VIC AB 2-0 CP2 18 (SUTURE) ×6 IMPLANT
SUT VIC AB 3-0 SH 8-18 (SUTURE) ×6 IMPLANT
SYR 3ML LL SCALE MARK (SYRINGE) IMPLANT
TOWEL GREEN STERILE (TOWEL DISPOSABLE) ×3 IMPLANT
TOWEL GREEN STERILE FF (TOWEL DISPOSABLE) ×3 IMPLANT
TRAY FOLEY W/METER SILVER 16FR (SET/KITS/TRAYS/PACK) ×3 IMPLANT
WATER STERILE IRR 1000ML POUR (IV SOLUTION) ×3 IMPLANT

## 2016-07-10 NOTE — Anesthesia Preprocedure Evaluation (Addendum)
Anesthesia Evaluation  Patient identified by MRN, date of birth, ID band Patient awake    Reviewed: Allergy & Precautions, NPO status , Patient's Chart, lab work & pertinent test results  Airway Mallampati: I  TM Distance: >3 FB Neck ROM: Full    Dental  (+) Teeth Intact, Dental Advisory Given   Pulmonary Current Smoker,    Pulmonary exam normal        Cardiovascular negative cardio ROS   Rhythm:Regular Rate:Normal     Neuro/Psych negative neurological ROS  negative psych ROS   GI/Hepatic negative GI ROS, Neg liver ROS,   Endo/Other  negative endocrine ROS  Renal/GU negative Renal ROS  negative genitourinary   Musculoskeletal  (+) Arthritis , Osteoarthritis,    Abdominal Normal abdominal exam  (+)   Peds negative pediatric ROS (+)  Hematology negative hematology ROS (+)   Anesthesia Other Findings   Reproductive/Obstetrics negative OB ROS                           Lab Results  Component Value Date   WBC 10.9 (H) 06/27/2016   HGB 14.5 06/27/2016   HCT 43.9 06/27/2016   MCV 95.6 06/27/2016   PLT 260 06/27/2016   Lab Results  Component Value Date   CREATININE 0.81 06/27/2016   BUN 10 06/27/2016   NA 138 06/27/2016   K 4.1 06/27/2016   CL 103 06/27/2016   CO2 26 06/27/2016   Lab Results  Component Value Date   INR 0.92 06/27/2016   EKG: normal sinus rhythm.  Anesthesia Physical Anesthesia Plan  ASA: II  Anesthesia Plan: General   Post-op Pain Management:    Induction: Intravenous  PONV Risk Score and Plan: 3 and Ondansetron, Dexamethasone, Propofol and Midazolam  Airway Management Planned: Oral ETT  Additional Equipment:   Intra-op Plan:   Post-operative Plan: Extubation in OR  Informed Consent: I have reviewed the patients History and Physical, chart, labs and discussed the procedure including the risks, benefits and alternatives for the proposed anesthesia  with the patient or authorized representative who has indicated his/her understanding and acceptance.   Dental advisory given  Plan Discussed with: CRNA  Anesthesia Plan Comments:        Anesthesia Quick Evaluation

## 2016-07-10 NOTE — Progress Notes (Signed)
Orthopedic Tech Progress Note Patient Details:  Tammy [name] 24-Nov-1970 [id number] Patient has brace. Patient ID: Tammy [name], female   DOB: 24-Nov-1970, 46 y.o.   MRN: [id number]   [name], [name] 07/10/2016, 6:08 PM

## 2016-07-10 NOTE — H&P (Signed)
Subjective: Patient is a 46 y.o. female admitted for PLIF for spinal deformity. Onset of symptoms was several years ago, gradually worsening since that time.  The pain is rated severe, unremitting, and is located at the across the lower back and radiates to hips. The pain is described as aching and occurs all day. The symptoms have been progressive. Symptoms are exacerbated by exercise. MRI or CT showed DD/ scoliosis   Past Medical History:  Diagnosis Date  . Arthritis   . Back pain   . Nail fungus     Past Surgical History:  Procedure Laterality Date  . ABDOMINAL HYSTERECTOMY    . BUNIONECTOMY     RIGHT FOOT  . TUBAL LIGATION      Prior to Admission medications   Medication Sig Start Date End Date Taking? Authorizing Provider  cyclobenzaprine (FLEXERIL) 5 MG tablet Take 5 mg by mouth 3 (three) times daily as needed. For muscle spasms 04/28/16  Yes [provider]  diclofenac (VOLTAREN) 75 MG EC tablet Take 75 mg by mouth daily as needed (for pain.).   Yes [provider]  oxyCODONE-acetaminophen (PERCOCET/ROXICET) 5-325 MG tablet Take 1 tablet by mouth 2 (two) times daily as needed. For pain. 04/29/16  Yes [provider]   Allergies  Allergen Reactions  . No Known Allergies     Social History  Substance Use Topics  . Smoking status: Current Every Day Smoker    Packs/day: 1.00    Years: 25.00    Types: Cigarettes  . Smokeless tobacco: Never Used  . Alcohol use 6.0 oz/week    10 Cans of beer per week     Comment: SOCIAL    History reviewed. No pertinent family history.   Review of Systems  Positive ROS: neg  All other systems have been reviewed and were otherwise negative with the exception of those mentioned in the HPI and as above.  Objective: Vital signs in last 24 hours: Temp:  [97.9 F (36.6 C)] 97.9 F (36.6 C) (06/20 0749) Pulse Rate:  [66] 66 (06/20 0749) Resp:  [18] 18 (06/20 0749) BP: (172-192)/(98-113) 172/98 (06/20 0813) SpO2:   [100 %] 100 % (06/20 0749) Weight:  [68.5 kg (151 lb)] 68.5 kg (151 lb) (06/20 0813)  General Appearance: Alert, cooperative, no distress, appears stated age Head: Normocephalic, without obvious abnormality, atraumatic Eyes: PERRL, conjunctiva/corneas clear, EOM's intact    Neck: Supple, symmetrical, trachea midline Back: Symmetric, no curvature, ROM normal, no CVA tenderness Lungs:  respirations unlabored Heart: Regular rate and rhythm Abdomen: Soft, non-tender Extremities: Extremities normal, atraumatic, no cyanosis or edema Pulses: 2+ and symmetric all extremities Skin: Skin color, texture, turgor normal, no rashes or lesions  NEUROLOGIC:   Mental status: Alert and oriented x4,  no aphasia, good attention span, fund of knowledge, and memory Motor Exam - grossly normal Sensory Exam - grossly normal Reflexes: 1= Coordination - grossly normal Gait - grossly normal Balance - grossly normal Cranial Nerves: I: smell Not tested  II: visual acuity  OS: nl    OD: nl  II: visual fields Full to confrontation  II: pupils Equal, round, reactive to light  III,VII: ptosis None  III,IV,VI: extraocular muscles  Full ROM  V: mastication Normal  V: facial light touch sensation  Normal  V,VII: corneal reflex  Present  VII: facial muscle function - upper  Normal  VII: facial muscle function - lower Normal  VIII: hearing Not tested  IX: soft palate elevation  Normal  IX,X:  gag reflex Present  XI: trapezius strength  5/5  XI: sternocleidomastoid strength 5/5  XI: neck flexion strength  5/5  XII: tongue strength  Normal    Data Review Lab Results  Component Value Date   WBC 10.9 (H) 06/27/2016   HGB 14.5 06/27/2016   HCT 43.9 06/27/2016   MCV 95.6 06/27/2016   PLT 260 06/27/2016   Lab Results  Component Value Date   NA 138 06/27/2016   K 4.1 06/27/2016   CL 103 06/27/2016   CO2 26 06/27/2016   BUN 10 06/27/2016   CREATININE 0.81 06/27/2016   GLUCOSE 78 06/27/2016   Lab  Results  Component Value Date   INR 0.92 06/27/2016    Assessment/Plan: Patient admitted for PLIF L2-3 L3-4 L4-5. Patient has failed a reasonable attempt at conservative therapy.  I explained the condition and procedure to the patient and answered any questions.  Patient wishes to proceed with procedure as planned. Understands risks/ benefits and typical outcomes of procedure.   Tanairi Cypert S 07/10/2016 9:46 AM

## 2016-07-10 NOTE — Anesthesia Procedure Notes (Signed)
Procedure Name: Intubation Date/Time: 07/10/2016 10:14 AM Performed by: Sampson Si E Pre-anesthesia Checklist: Patient identified, Emergency Drugs available, Suction available and Patient being monitored Patient Re-evaluated:Patient Re-evaluated prior to inductionOxygen Delivery Method: Circle System Utilized Preoxygenation: Pre-oxygenation with 100% oxygen Intubation Type: IV induction Ventilation: Mask ventilation without difficulty Laryngoscope Size: Mac and 3 Grade View: Grade I Tube type: Oral Number of attempts: 1 Airway Equipment and Method: Stylet and Oral airway Placement Confirmation: ETT inserted through vocal cords under direct vision,  positive ETCO2 and breath sounds checked- equal and bilateral Secured at: 20 cm Tube secured with: Tape Dental Injury: Teeth and Oropharynx as per pre-operative assessment

## 2016-07-10 NOTE — Anesthesia Postprocedure Evaluation (Signed)
Anesthesia Post Note  Patient: Tammy [name]  Procedure(s) Performed: Procedure(s) (LRB): Lumbar two-Lumbar three - Lumbar three-Lumbar four - Lumbar four-Lumbar five MAXIMUM ACCESS (MAS) POSTERIOR LUMBAR INTERBODY FUSION (N/A)     Patient location during evaluation: PACU Anesthesia Type: General Level of consciousness: awake Pain management: pain level controlled Respiratory status: spontaneous breathing Cardiovascular status: stable Anesthetic complications: no    Last Vitals:  Vitals:   07/10/16 0749 07/10/16 0813  BP: (!) 192/113 (!) 172/98  Pulse: 66   Resp: 18   Temp: 36.6 C     Last Pain:  Vitals:   07/10/16 0749  TempSrc: Oral                 Nasser Ku

## 2016-07-10 NOTE — Op Note (Signed)
07/10/2016  2:55 PM  PATIENT:  Tammy BenNicole E [name]  46 y.o. female  PRE-OPERATIVE DIAGNOSIS:  Lumbar spondylosis/ stenosis/ loss of sagittal and coronal balance, retrolisthesis, back pain  POST-OPERATIVE DIAGNOSIS:  same  PROCEDURE:   1. Decompressive lumbar laminectomy L2-3 L3-4,L4-5 requiring more work than would be required for a simple exposure of the disk for PLIF in order to adequately decompress the neural elements and address the spinal stenosis 2. Posterior lumbar interbody fusion L2-3 L3-4 L4-5 using PEEK interbody cages packed with morcellized allograft and autograft 3. Posterior fixation L2-L5 inclusive using cortical pedicle screws.  4. Intertransverse arthrodesis L2-5 using morcellized autograft and allograft.  SURGEON:  Marikay Alaravid Javonn Gauger, MD  ASSISTANTS: Dr Newell Coralnudelman  ANESTHESIA:  General  EBL: 200 ml  Total I/O In: 2000 [I.V.:2000] Out: 550 [Urine:350; Blood:200]  BLOOD ADMINISTERED:none  DRAINS: none   INDICATION FOR PROCEDURE: This patient presented with back pain. Imaging revealed scoliosis/ stenosis. The patient tried a reasonable attempt at conservative medical measures without relief. I recommended decompression and instrumented fusion to address the stenosis as well as the segment once stability.  Patient understood the risks, benefits, and alternatives and potential outcomes and wished to proceed.  PROCEDURE DETAILS:  The patient was brought to the operating room. After induction of generalized endotracheal anesthesia the patient was rolled into the prone position on chest rolls and all pressure points were padded. The patient's lumbar region was cleaned and then prepped with DuraPrep and draped in the usual sterile fashion. Anesthesia was injected and then a dorsal midline incision was made and carried down to the lumbosacral fascia. The fascia was opened and the paraspinous musculature was taken down in a subperiosteal fashion to expose L2-5. A self-retaining  retractor was placed. Intraoperative fluoroscopy confirmed my level, and I started with placement of the L2 cortical pedicle screws. The pedicle screw entry zones were identified utilizing surface landmarks and  AP and lateral fluoroscopy. I scored the cortex with the high-speed drill and then used the hand drill and EMG monitoring to drill an upward and outward direction into the pedicle. I then tapped line to line, and the tap was also monitored. I then placed a 5.0 by 35 mm cortical pedicle screw into the pedicles of L2 bilaterally. I then turned my attention to the decompression and complete lumbar laminectomies, hemi- facetectomies, and foraminotomies were performed at L2-3 L3-4 L4-5. The patient had significant spinal stenosis and this required more work than would be required for a simple exposure of the disc for posterior lumbar interbody fusion. Much more generous decompression was undertaken in order to adequately decompress the neural elements and address the patient's leg pain. The yellow ligament was removed to expose the underlying dura and nerve roots, and generous foraminotomies were performed to adequately decompress the neural elements. Both the exiting and traversing nerve roots were decompressed on both sides until a coronary dilator passed easily along the nerve roots at each level. Once the decompression was complete, I turned my attention to the posterior lower lumbar interbody fusion. The epidural venous vasculature was coagulated and cut sharply. Disc space was incised and the initial discectomy was performed with pituitary rongeurs. The disc space was distracted with sequential distractors to a height of 8 mm at each level. We then used a series of scrapers and shavers to prepare the endplates for fusion. The midline was prepared with Epstein curettes. Once the complete discectomy was finished, we packed an appropriate sized peek interbody cage with local autograft and  morcellized  allograft, gently retracted the nerve root, and tapped the cage into position at L2-3 l3-4 L4-5.  The midline between the cages was packed with morselized autograft and allograft. We then turned our attention to the placement of the lower pedicle screws. The pedicle screw entry zones were identified utilizing surface landmarks and fluoroscopy. I drilled into each pedicle utilizing the hand drill and EMG monitoring, and tapped each pedicle with the appropriate tap. We palpated with a ball probe to assure no break in the cortex. We then placed 5.5 by 35 mm screws into the pedicles bilaterally at L3,L4,L5. We then decorticated the transverse processes and laid a mixture of morcellized autograft and allograft out over these to perform intertransverse arthrodesis at L2-5. We then placed lordotic rods into the multiaxial screw heads of the pedicle screws and locked these in position with the locking caps and anti-torque device. We then checked our construct with AP and lateral fluoroscopy. Irrigated with copious amounts of bacitracin-containing saline solution. Inspected the nerve roots once again to assure adequate decompression, lined to the dura with Gelfoam, and closed the muscle and the fascia with 0 Vicryl. Closed the subcutaneous tissues with 2-0 Vicryl and subcuticular tissues with 3-0 Vicryl. The skin was closed with benzoin and Steri-Strips. Dressing was then applied, the patient was awakened from general anesthesia and transported to the recovery room in stable condition. At the end of the procedure all sponge, needle and instrument counts were correct.   PLAN OF CARE: admit to inpatient  PATIENT DISPOSITION:  PACU - hemodynamically stable.   Delay start of Pharmacological VTE agent (>24hrs) due to surgical blood loss or risk of bleeding:  yes

## 2016-07-10 NOTE — Transfer of Care (Signed)
Immediate Anesthesia Transfer of Care Note  Patient: Tammy [name]  Procedure(s) Performed: Procedure(s): Lumbar two-Lumbar three - Lumbar three-Lumbar four - Lumbar four-Lumbar five MAXIMUM ACCESS (MAS) POSTERIOR LUMBAR INTERBODY FUSION (N/A)  Patient Location: PACU  Anesthesia Type:General  Level of Consciousness: awake  Airway & Oxygen Therapy: Patient Spontanous Breathing and Patient connected to nasal cannula oxygen  Post-op Assessment: Report given to RN and Patient moving all extremities X 4  Post vital signs: Reviewed and stable  Last Vitals:  Vitals:   07/10/16 0749 07/10/16 0813  BP: (!) 192/113 (!) 172/98  Pulse: 66   Resp: 18   Temp: 36.6 C     Last Pain:  Vitals:   07/10/16 0749  TempSrc: Oral      Patients Stated Pain Goal: 6 (07/10/16 0813)  Complications: No apparent anesthesia complications

## 2016-07-11 ENCOUNTER — Encounter (HOSPITAL_COMMUNITY): Payer: Self-pay | Admitting: Neurological Surgery

## 2016-07-11 MED ORDER — OXYCODONE-ACETAMINOPHEN 5-325 MG PO TABS: 1.0000 | ORAL_TABLET | Freq: Four times a day (QID) | ORAL | 0 refills | Status: AC | PRN

## 2016-07-11 MED ORDER — ALUM & MAG HYDROXIDE-SIMETH 200-200-20 MG/5ML PO SUSP
30.0000 mL | Freq: Once | ORAL | Status: DC
  Filled 2016-07-11: qty 30

## 2016-07-11 NOTE — Evaluation (Signed)
Occupational Therapy Evaluation Patient Details Name: Tammy [name] MRN: [id number] DOB: 1970/06/12 Today's Date: 07/11/2016    History of Present Illness 46 y.o.femaleadmitted for PLIF for spinal deformity. s/p lumbar fusion 07/10/16.   Clinical Impression   PTA, pt was living with her daughter and was independent. Currently, pt performing ADLs and functional mobility at a Mod I level with increased time. Provided education on back precautions, LB ADLs with AE, toilet transfer, and tub transfer with 3N1; pt demonstrated understanding. Answered all pt questions. Recommend dc home once medically stable per physician. All acute OT needs met and will sign off. Thank you.     Follow Up Recommendations  DC plan and follow up therapy as arranged by surgeon;No OT follow up;Supervision/Assistance - 24 hour    Equipment Recommendations  3 in 1 bedside commode    Recommendations for Other Services PT consult     Precautions / Restrictions Precautions Precautions: Back;Fall Precaution Booklet Issued: Yes (comment) Precaution Comments: Provided handout and education on back precautions with ADLs. Pt able to recalled 3/3 back precautions.  Required Braces or Orthoses: Spinal Brace Spinal Brace: Applied in sitting position;Lumbar corset;Other (comment) ([place])      Mobility Bed Mobility Overal bed mobility: Needs Assistance Bed Mobility: Rolling;Sidelying to Sit Rolling: Supervision Sidelying to sit: Supervision       General bed mobility comments: Provided education on log roll technique for bed mobility as well as sleeping positions. Pt demonstrated understanding and performed bed mobility with supervision and Min VCs  Transfers Overall transfer level: Modified independent               General transfer comment: Increased time    Balance Overall balance assessment: No apparent balance deficits (not formally assessed)                                         ADL either performed or assessed with clinical judgement   ADL Overall ADL's : Modified independent                                       General ADL Comments: Provided education on LB ADLs with AE. Pt performed dressing and toileting at Mod I level with education on adherance to back precautions during ADLs. Pt demonstrating good functional performance.      Vision Baseline Vision/History: Wears glasses Wears Glasses: Reading only Patient Visual Report: No change from baseline       Perception     Praxis      Pertinent Vitals/Pain Pain Assessment: Faces Faces Pain Scale: Hurts little more Pain Location: Lower back Pain Descriptors / Indicators: Constant;Discomfort;Grimacing Pain Intervention(s): Monitored during session;Repositioned;Relaxation     Hand Dominance Right   Extremity/Trunk Assessment Upper Extremity Assessment Upper Extremity Assessment: Overall WFL for tasks assessed   Lower Extremity Assessment Lower Extremity Assessment: Overall WFL for tasks assessed   Cervical / Trunk Assessment Cervical / Trunk Assessment: Other exceptions (s/p spinal fusion)   Communication Communication Communication: No difficulties   Cognition Arousal/Alertness: Awake/alert Behavior During Therapy: WFL for tasks assessed/performed Overall Cognitive Status: Within Functional Limits for tasks assessed  General Comments       Exercises     Shoulder Instructions      Home Living Family/patient expects to be discharged to:: Private residence Living Arrangements: Children Available Help at Discharge: Family;Available 24 hours/day (Daughter and sistering staying with her at dc)               Bathroom Shower/Tub: Tub/shower unit;Curtain   Biochemist, clinical: Standard     Home Equipment: None          Prior Functioning/Environment Level of Independence: Independent                 OT  Problem List: Decreased range of motion;Decreased activity tolerance;Decreased knowledge of use of DME or AE;Decreased safety awareness;Decreased knowledge of precautions;Pain      OT Treatment/Interventions:      OT Goals(Current goals can be found in the care plan section) Acute Rehab OT Goals Patient Stated Goal: Go home OT Goal Formulation: With patient Time For Goal Achievement: 07/25/16 Potential to Achieve Goals: Good  OT Frequency:     Barriers to D/C:            Co-evaluation              AM-PAC PT "6 Clicks" Daily Activity     Outcome Measure Help from another person eating meals?: None Help from another person taking care of personal grooming?: None Help from another person toileting, which includes using toliet, bedpan, or urinal?: A Little Help from another person bathing (including washing, rinsing, drying)?: A Little Help from another person to put on and taking off regular upper body clothing?: None Help from another person to put on and taking off regular lower body clothing?: A Little 6 Click Score: 21   End of Session Equipment Utilized During Treatment: Back brace Nurse Communication: Mobility status;Precautions  Activity Tolerance: Patient tolerated treatment well Patient left: in chair;with call bell/phone within reach  OT Visit Diagnosis: Other abnormalities of gait and mobility (R26.89);Pain;Muscle weakness (generalized) (M62.81) Pain - Right/Left:  (back) Pain - part of body:  (Back)                Time: 6644-0347 OT Time Calculation (min): 17 min Charges:  OT General Charges $OT Visit: 1 Procedure OT Evaluation $OT Eval Low Complexity: 1 Procedure G-Codes:     [place], OTR/L Acute Rehab Pager: [phone] Office: [place] 07/11/2016, 9:09 AM

## 2016-07-11 NOTE — Discharge Summary (Signed)
Physician Discharge Summary  Patient ID: Tammy [name] MRN: [id number] DOB/AGE: 48-Dec-1972 48 y.o.  Admit date: 07/10/2016 Discharge date: 07/11/2016  Admission Diagnoses: Lumbar spondylosis/ stenosis/ loss of sagittal and coronal balance, retrolisthesis, back pain   Discharge Diagnoses: same as admitting   Discharged Condition: good  Hospital Course: The patient was admitted on 07/10/2016 and taken to the operating room where the patient underwent Decompressive lumbar laminextomy L2-3, L3-4, L4-5. The patient tolerated the procedure well and was taken to the recovery room and then to the floor in stable condition. The hospital course was routine. There were no complications. The wound remained clean dry and intact. Pt had appropriate back soreness. No complaints of new pain or new N/T/W. The patient remained afebrile with stable vital signs, and tolerated a regular diet. The patient continued to increase activities, and pain was well controlled with oral pain medications.   Consults: None  Significant Diagnostic Studies:  Results for orders placed or performed during the hospital encounter of 06/27/16  Surgical pcr screen  Result Value Ref Range   MRSA, PCR NEGATIVE NEGATIVE   Staphylococcus aureus NEGATIVE NEGATIVE  Basic metabolic panel  Result Value Ref Range   Sodium 138 135 - 145 mmol/L   Potassium 4.1 3.5 - 5.1 mmol/L   Chloride 103 101 - 111 mmol/L   CO2 26 22 - 32 mmol/L   Glucose, Bld 78 65 - 99 mg/dL   BUN 10 6 - 20 mg/dL   Creatinine, Ser 1.300.81 0.44 - 1.00 mg/dL   Calcium 9.4 8.9 - 86.510.3 mg/dL   GFR calc non Af Amer >60 >60 mL/min   GFR calc Af Amer >60 >60 mL/min   Anion gap 9 5 - 15  CBC WITH DIFFERENTIAL  Result Value Ref Range   WBC 10.9 (H) 4.0 - 10.5 K/uL   RBC 4.59 3.87 - 5.11 MIL/uL   Hemoglobin 14.5 12.0 - 15.0 g/dL   HCT 78.443.9 69.636.0 - 29.546.0 %   MCV 95.6 78.0 - 100.0 fL   MCH 31.6 26.0 - 34.0 pg   MCHC 33.0 30.0 - 36.0 g/dL   RDW 28.413.7 13.211.5 - 44.015.5 %   Platelets 260 150 - 400 K/uL   Neutrophils Relative % 67 %   Neutro Abs 7.2 1.7 - 7.7 K/uL   Lymphocytes Relative 26 %   Lymphs Abs 2.9 0.7 - 4.0 K/uL   Monocytes Relative 6 %   Monocytes Absolute 0.7 0.1 - 1.0 K/uL   Eosinophils Relative 1 %   Eosinophils Absolute 0.2 0.0 - 0.7 K/uL   Basophils Relative 0 %   Basophils Absolute 0.0 0.0 - 0.1 K/uL  Protime-INR  Result Value Ref Range   Prothrombin Time 12.3 11.4 - 15.2 seconds   INR 0.92   [facility] and screen [facility]  Result Value Ref Range   ABO/RH(D) O POS    Antibody Screen NEG    Sample Expiration 07/11/2016    Extend sample reason NO TRANSFUSIONS OR PREGNANCY IN THE PAST 3 MONTHS   ABO/Rh  Result Value Ref Range   ABO/RH(D) O POS     Chest 2 View  Result Date: 06/27/2016 CLINICAL DATA:  Preop lumbar surgery EXAM: CHEST  2 VIEW COMPARISON:  None. FINDINGS: Heart and mediastinal contours are within normal limits. No focal opacities or effusions. No acute bony abnormality. IMPRESSION: No active cardiopulmonary disease. Electronically Signed   By: Charlett NoseKevin  Dover M.D.   On: 06/27/2016 11:38   Dg Lumbar Spine  2-3 Views  Result Date: 07/10/2016 CLINICAL DATA:  46 year old female undergoing multilevel posterior lumbar interbody fusion EXAM: LUMBAR SPINE - 2-3 VIEW; DG C-ARM 61-120 MIN COMPARISON:  None. FINDINGS: AP and cross-table lateral intraoperative images demonstrate surgical changes of L2-L5 posterior lumbar interbody fusion with bilateral pedicle screw and rod construct. Interbody grafts are present at L2-L3, L3-L4, and L4-L5. No evidence of immediate hardware complication or hardware fracture. IMPRESSION: Posterior lumbar interbody fusion extending from L2 through L5 with interbody grafts at each level. No evidence of immediate hardware complication. Electronically Signed   By: Malachy Moan M.D.   On: 07/10/2016 14:28   Dg C-arm 61-120 Min  Result Date: 07/10/2016 CLINICAL DATA:  46 year old female  undergoing multilevel posterior lumbar interbody fusion EXAM: LUMBAR SPINE - 2-3 VIEW; DG C-ARM 61-120 MIN COMPARISON:  None. FINDINGS: AP and cross-table lateral intraoperative images demonstrate surgical changes of L2-L5 posterior lumbar interbody fusion with bilateral pedicle screw and rod construct. Interbody grafts are present at L2-L3, L3-L4, and L4-L5. No evidence of immediate hardware complication or hardware fracture. IMPRESSION: Posterior lumbar interbody fusion extending from L2 through L5 with interbody grafts at each level. No evidence of immediate hardware complication. Electronically Signed   By: Malachy Moan M.D.   On: 07/10/2016 14:28    Antibiotics:  Anti-infectives    Start     Dose/Rate Route Frequency Ordered Stop   07/10/16 2200  ceFAZolin (ANCEF) IVPB 2g/100 mL premix     2 g 200 mL/hr over 30 Minutes Intravenous Every 8 hours 07/10/16 1526 07/11/16 1359   07/10/16 1004  vancomycin (VANCOCIN) powder  Status:  Discontinued       As needed 07/10/16 1004 07/10/16 1444   07/10/16 1000  bacitracin 50,000 Units in sodium chloride irrigation 0.9 % 500 mL irrigation  Status:  Discontinued       As needed 07/10/16 1000 07/10/16 1444   07/10/16 0930  ceFAZolin (ANCEF) IVPB 2g/100 mL premix     2 g 200 mL/hr over 30 Minutes Intravenous On call to O.R. 07/09/16 1525 07/10/16 1432      Discharge Exam: Blood pressure 135/77, pulse 82, temperature 98.3 F (36.8 C), temperature source Oral, resp. rate 18, height 5' 7.5" (1.715 m), weight 68.5 kg (151 lb), SpO2 95 %. Neurologic: Grossly normal Ambulating and voiding well.   Discharge Medications:   Allergies as of 07/11/2016      Reactions   No Known Allergies       Medication List    TAKE these medications   cyclobenzaprine 5 MG tablet Commonly known as:  FLEXERIL Take 5 mg by mouth 3 (three) times daily as needed. For muscle spasms   diclofenac 75 MG EC tablet Commonly known as:  VOLTAREN Take 75 mg by mouth daily  as needed (for pain.).   oxyCODONE-acetaminophen 5-325 MG tablet Commonly known as:  ROXICET Take 1-2 tablets by mouth every 6 (six) hours as needed. What changed:  how much to take  when to take this  additional instructions            Durable Medical Equipment        Start     Ordered   07/10/16 1707  DME Walker rolling  Once    Question:  Patient needs a walker to treat with the following condition  Answer:  S/P lumbar fusion   07/10/16 1707   07/10/16 1707  DME 3 n 1  Once     07/10/16 1707  Disposition: home   Final Dx: same as admitting  Discharge Instructions    Call MD for:  difficulty breathing, headache or visual disturbances    Complete by:  As directed    Call MD for:  extreme fatigue    Complete by:  As directed    Call MD for:  hives    Complete by:  As directed    Call MD for:  persistant dizziness or light-headedness    Complete by:  As directed    Call MD for:  persistant nausea and vomiting    Complete by:  As directed    Call MD for:  redness, tenderness, or signs of infection (pain, swelling, redness, odor or green/yellow discharge around incision site)    Complete by:  As directed    Call MD for:  severe uncontrolled pain    Complete by:  As directed    Call MD for:  temperature >100.4    Complete by:  As directed    Diet - low sodium heart healthy    Complete by:  As directed    Increase activity slowly    Complete by:  As directed      Patient doing well. Still has some back pain. Will follow up in office in 2 weeks.     Signed: Tiana Loft Meyran 07/11/2016, 7:41 AM

## 2016-07-11 NOTE — Care Management Note (Addendum)
Case Management Note  Patient Details  Name: Tammy [name] MRN: [id number] Date of Birth: 31-May-1970  Subjective/Objective:        CM following for progression and d/c planning.             Action/Plan: Per Pt RN they have ordered a 3:1 for this pt to take home, no other needs identified.  Per PT and OT evals this pt has no need for HHPT or HHOT at this time.   Expected Discharge Date:  07/11/16               Expected Discharge Plan:  Home/Self Care  In-House Referral:  NA  Discharge planning Services  CM Consult  Post Acute Care Choice:  Durable Medical Equipment Choice offered to:  Patient  DME Arranged:  3-N-1 DME Agency:  [facility]  HH Arranged:  NA HH Agency:  NA  Status of Service:  Completed, signed off  If discussed at Long Length of Stay Meetings, dates discussed:    Additional Comments:  Starlyn SkeansRoyal, Jakyle Petrucelli U, RN 07/11/2016, 11:37 AM

## 2016-07-11 NOTE — Progress Notes (Signed)
Patient is discharged from room 3C10 at this time. Alert and in stable condition. IV site d/c'd and instructions read to patient and family with understanding verbalized. Left unit via wheelchair with all belongings at side.   

## 2016-07-11 NOTE — Evaluation (Signed)
Physical Therapy Evaluation and Discharge Patient Details Name: Tammy [name] MRN: [id number] DOB: 17-Jan-1971 Today's Date: 07/11/2016   History of Present Illness  Pt is a 46 y.o.femaleadmitted for PLIF for spinal deformity. s/p lumbar fusion 07/10/16.  Clinical Impression  Pt presented sitting OOB in recliner chair, awake and willing to participate in therapy session. Prior to admission, pt reported that she was independent with all functional mobility and ADLs. Pt ambulated in hallway with supervision and completed stair training with supervision, no instability or LOB. PT reviewed 3/3 back precautions with pt throughout. No further acute PT needs identified at this time. PT signing off.     Follow Up Recommendations No PT follow up    Equipment Recommendations  3in1 (PT)    Recommendations for Other Services       Precautions / Restrictions Precautions Precautions: Back;Fall Precaution Booklet Issued: No (OT provided) Precaution Comments: PT reviewed 3/3 back precautions Required Braces or Orthoses: Spinal Brace Spinal Brace: Lumbar corset;Applied in sitting position Restrictions Weight Bearing Restrictions: No      Mobility  Bed Mobility Overal bed mobility: Needs Assistance Bed Mobility: Rolling;Sidelying to Sit Rolling: Supervision Sidelying to sit: Supervision       General bed mobility comments: Pt sitting OOB in recliner chair upon arrival, PT reviewed log roll technique  Transfers Overall transfer level: Modified independent               General transfer comment: Increased time  Ambulation/Gait Ambulation/Gait assistance: Supervision Ambulation Distance (Feet): 400 Feet Assistive device: None Gait Pattern/deviations: Step-through pattern Gait velocity: decreased Gait velocity interpretation: Below normal speed for age/gender General Gait Details: no instability or LOB, supervision for safety  Stairs Stairs: Yes Stairs assistance:  Supervision Stair Management: One rail Right;Alternating pattern;Forwards Number of Stairs: 10    Wheelchair Mobility    Modified Rankin (Stroke Patients Only)       Balance Overall balance assessment: Needs assistance Sitting-balance support: Feet supported Sitting balance-Leahy Scale: Good     Standing balance support: During functional activity;No upper extremity supported Standing balance-Leahy Scale: Good                               Pertinent Vitals/Pain Pain Assessment: Faces Faces Pain Scale: Hurts little more Pain Location: Lower back Pain Descriptors / Indicators: Constant;Discomfort;Grimacing Pain Intervention(s): Monitored during session;Repositioned    Home Living Family/patient expects to be discharged to:: Private residence Living Arrangements: Children Available Help at Discharge: Family;Available 24 hours/day Type of Home: House Home Access: Stairs to enter Entrance Stairs-Rails: Doctor, general practice of Steps: 2 Home Layout: One level Home Equipment: None      Prior Function Level of Independence: Independent         Comments: 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, 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    Extremity/Trunk Assessment   Upper Extremity Assessment Upper Extremity Assessment: Defer to OT evaluation    Lower Extremity Assessment Lower Extremity Assessment: Overall WFL for tasks assessed    Cervical / Trunk Assessment Cervical / Trunk Assessment: Other exceptions Cervical / Trunk Exceptions: s/p lumbar sx  Communication   Communication: No difficulties  Cognition Arousal/Alertness: Awake/alert Behavior During Therapy: WFL for tasks assessed/performed Overall Cognitive Status: Within Functional Limits for tasks assessed  General Comments      Exercises     Assessment/Plan    PT Assessment Patent does not need any  further PT services  PT Problem List         PT Treatment Interventions      PT Goals (Current goals can be found in the Care Plan section)  Acute Rehab PT Goals Patient Stated Goal: return home today    Frequency     Barriers to discharge        Co-evaluation               AM-PAC PT "6 Clicks" Daily Activity  Outcome Measure Difficulty turning over in bed (including adjusting bedclothes, sheets and blankets)?: A Little Difficulty moving from lying on back to sitting on the side of the bed? : A Little Difficulty sitting down on and standing up from a chair with arms (e.g., wheelchair, bedside commode, etc,.)?: A Little Help needed moving to and from a bed to chair (including a wheelchair)?: None Help needed walking in hospital room?: None Help needed climbing 3-5 steps with a railing? : None 6 Click Score: 21    End of Session Equipment Utilized During Treatment: Back brace Activity Tolerance: Patient tolerated treatment well Patient left: with call bell/phone within reach;Other (comment) (standing in room) Nurse Communication: Mobility status PT Visit Diagnosis: Other abnormalities of gait and mobility (R26.89);Pain Pain - part of body:  (back)    Time: [id number] PT Time Calculation (min) (ACUTE ONLY): 13 min   Charges:   PT Evaluation $PT Eval Moderate Complexity: 1 Procedure     PT G Codes:        Deborah ChalkJennifer Chariti Havel, PT, DPT [phone]   Alessandra BevelsJennifer M Daniel Ritthaler 07/11/2016, 9:30 AM

## 2016-07-12 MED FILL — Heparin Sodium (Porcine) Inj 1000 Unit/ML: INTRAMUSCULAR | Qty: 30 | Status: AC

## 2016-07-12 MED FILL — Sodium Chloride IV Soln 0.9%: INTRAVENOUS | Qty: 1000 | Status: AC

## 2016-07-22 NOTE — Anesthesia Postprocedure Evaluation (Signed)
Anesthesia Post Note  Patient: Tammy [name]  Procedure(s) Performed: Procedure(s) (LRB): Lumbar two-Lumbar three - Lumbar three-Lumbar four - Lumbar four-Lumbar five MAXIMUM ACCESS (MAS) POSTERIOR LUMBAR INTERBODY FUSION (N/A)     Patient location during evaluation: PACU Anesthesia Type: General Level of consciousness: awake Pain management: pain level controlled Vital Signs Assessment: post-procedure vital signs reviewed and stable Respiratory status: spontaneous breathing Cardiovascular status: stable Anesthetic complications: no    Last Vitals:  Vitals:   07/11/16 0418 07/11/16 0827  BP: 135/77 132/69  Pulse: 82 80  Resp: 18 18  Temp: 36.8 C 36.9 C    Last Pain:  Vitals:   07/11/16 1000  TempSrc:   PainSc: 3                  Silvina Hackleman

## 2016-07-22 NOTE — Addendum Note (Signed)
Addendum  created 07/22/16 1909 by Dorris SinghGreen, Ailani Governale, MD   Sign clinical note

## 2016-07-29 DIAGNOSIS — N3000 Acute cystitis without hematuria: Secondary | ICD-10-CM | POA: Diagnosis not present

## 2016-07-29 DIAGNOSIS — N3001 Acute cystitis with hematuria: Secondary | ICD-10-CM | POA: Diagnosis not present

## 2016-08-26 DIAGNOSIS — M4126 Other idiopathic scoliosis, lumbar region: Secondary | ICD-10-CM | POA: Diagnosis not present

## 2016-12-03 DIAGNOSIS — M4126 Other idiopathic scoliosis, lumbar region: Secondary | ICD-10-CM | POA: Diagnosis not present

## 2017-01-16 DIAGNOSIS — J00 Acute nasopharyngitis [common cold]: Secondary | ICD-10-CM | POA: Diagnosis not present

## 2017-02-03 DIAGNOSIS — Z23 Encounter for immunization: Secondary | ICD-10-CM | POA: Diagnosis not present

## 2017-02-03 DIAGNOSIS — Z6824 Body mass index (BMI) 24.0-24.9, adult: Secondary | ICD-10-CM | POA: Diagnosis not present

## 2017-02-03 DIAGNOSIS — Z0000 Encounter for general adult medical examination without abnormal findings: Secondary | ICD-10-CM | POA: Diagnosis not present

## 2017-07-02 DIAGNOSIS — M79671 Pain in right foot: Secondary | ICD-10-CM | POA: Diagnosis not present

## 2017-07-02 DIAGNOSIS — A932 Colorado tick fever: Secondary | ICD-10-CM | POA: Diagnosis not present

## 2017-07-03 DIAGNOSIS — R079 Chest pain, unspecified: Secondary | ICD-10-CM | POA: Diagnosis not present

## 2017-07-03 DIAGNOSIS — R072 Precordial pain: Secondary | ICD-10-CM | POA: Diagnosis not present

## 2017-07-03 DIAGNOSIS — R0789 Other chest pain: Secondary | ICD-10-CM | POA: Diagnosis not present

## 2017-07-03 DIAGNOSIS — F17210 Nicotine dependence, cigarettes, uncomplicated: Secondary | ICD-10-CM | POA: Diagnosis not present

## 2017-07-07 DIAGNOSIS — Z1231 Encounter for screening mammogram for malignant neoplasm of breast: Secondary | ICD-10-CM | POA: Diagnosis not present

## 2017-12-01 ENCOUNTER — Ambulatory Visit: Payer: 59 | Admitting: Podiatry

## 2017-12-01 ENCOUNTER — Encounter: Payer: Self-pay | Admitting: Podiatry

## 2017-12-01 ENCOUNTER — Ambulatory Visit (INDEPENDENT_AMBULATORY_CARE_PROVIDER_SITE_OTHER): Payer: 59

## 2017-12-01 VITALS — BP 116/71 | HR 77 | Resp 16 | Ht 67.0 in | Wt 168.0 lb

## 2017-12-01 DIAGNOSIS — L608 Other nail disorders: Secondary | ICD-10-CM | POA: Diagnosis not present

## 2017-12-01 DIAGNOSIS — B351 Tinea unguium: Secondary | ICD-10-CM | POA: Diagnosis not present

## 2017-12-01 DIAGNOSIS — G588 Other specified mononeuropathies: Secondary | ICD-10-CM

## 2017-12-01 MED ORDER — FLUCONAZOLE 150 MG PO TABS: 150.0000 mg | ORAL_TABLET | ORAL | 1 refills | Status: DC

## 2017-12-01 NOTE — Progress Notes (Signed)
  Subjective:  Patient ID: Tammy [name], female    DOB: 10-08-1970,  MRN: [id number]  Chief Complaint  Patient presents with  . Foot Pain    Right 2nd and 3rd MPJ pain and 2nd toe abnormal curving x June; 7/10." Tx: good supp. shoes, icing, elevation, and aleve -wosrt in the AM   47 y.o. female presents with the above complaint. History as above.  Review of Systems: Negative except as noted in the HPI. Denies N/V/F/Ch.  Past Medical History:  Diagnosis Date  . Arthritis   . Back pain   . Nail fungus     Current Outpatient Medications:  .  fluconazole (DIFLUCAN) 150 MG tablet, Take 1 tablet (150 mg total) by mouth once a week., Disp: 6 tablet, Rfl: 1  Social History   Tobacco Use  Smoking Status Current Every Day Smoker  . Packs/day: 1.00  . Years: 25.00  . Pack years: 25.00  . Types: Cigarettes  Smokeless Tobacco Never Used    Allergies  Allergen Reactions  . No Known Allergies    Objective:   Vitals:   12/01/17 1510  BP: 116/71  Pulse: 77  Resp: 16   Body mass index is 26.31 kg/m. Constitutional Well developed. Well nourished.  Vascular Dorsalis pedis pulses palpable bilaterally. Posterior tibial pulses palpable bilaterally. Capillary refill normal to all digits.  No cyanosis or clubbing noted. Pedal hair growth normal.  Neurologic Normal speech. Oriented to person, place, and time. Epicritic sensation to light touch grossly present bilaterally.  Dermatologic Nails well groomed and normal in appearance. No open wounds. No skin lesions.  Orthopedic: Normal joint ROM without pain or crepitus bilaterally. No visible deformities. No bony tenderness.   Radiographs: Taken and reviewed. Radiographic Conley Canal sign. Evidence of prior bunioenctomy with intact pins. Elongated 2nd met, decreased 2nd/3rd IMA.  Assessment:   1. Interdigital neuroma   2. Nail fungus   3. Onychomadesis    Plan:  Patient was evaluated and treated and all questions  answered.  Interdigital Neuroma, right -Educated on etiology -Interspace injection delivered as below. -Educated on padding and proper shoegear  Procedure: Neuroma Injection Location: Right 2nd interspace Skin Prep: Alcohol. Injectate: 0.5 cc 0.5% marcaine plain, 0.5 cc dexamethasone phosphate. Disposition: Patient tolerated procedure well. Injection site dressed with a band-aid.  Onychomycosis with Onychomadesis -Rx fluconazole weekly. -Nail avulsed due to severe lysis. -Nail sent for fungal culture and histo  Procedure: Avulsion of toenail Location: Left 1st toe  Anesthesia: Lidocaine 1% plain; 1.5 mL and Marcaine 0.5% plain; 1.5 mL, digital block. Skin Prep: Betadine. Dressing: Silvadene; telfa; dry, sterile, compression dressing. Technique: Following skin prep, the toe was exsanguinated and a tourniquet was secured at the base of the toe. The nail was freed and avulsed with a hemostat. The area was cleansed. The tourniquet was then removed and sterile dressing applied. Disposition: Patient tolerated procedure well.    Return in about 5 weeks (around 01/05/2018) for Nail Fungus.

## 2017-12-01 NOTE — Patient Instructions (Signed)

## 2017-12-01 NOTE — Progress Notes (Signed)
   Subjective:    Patient ID: Tammy [name], female    DOB: 26-Jul-1970, 47 y.o.   MRN: [id number]  HPI    Review of Systems  Musculoskeletal: Positive for myalgias.  All other systems reviewed and are negative.      Objective:   Physical Exam        Assessment & Plan:

## 2017-12-03 ENCOUNTER — Other Ambulatory Visit: Payer: Self-pay | Admitting: Podiatry

## 2017-12-03 DIAGNOSIS — L608 Other nail disorders: Secondary | ICD-10-CM

## 2017-12-03 DIAGNOSIS — G588 Other specified mononeuropathies: Secondary | ICD-10-CM

## 2018-01-05 ENCOUNTER — Ambulatory Visit (INDEPENDENT_AMBULATORY_CARE_PROVIDER_SITE_OTHER): Payer: 59 | Admitting: Podiatry

## 2018-01-05 DIAGNOSIS — Z79899 Other long term (current) drug therapy: Secondary | ICD-10-CM

## 2018-01-05 DIAGNOSIS — B351 Tinea unguium: Secondary | ICD-10-CM | POA: Diagnosis not present

## 2018-01-05 DIAGNOSIS — G588 Other specified mononeuropathies: Secondary | ICD-10-CM

## 2018-01-05 DIAGNOSIS — L603 Nail dystrophy: Secondary | ICD-10-CM | POA: Diagnosis not present

## 2018-01-05 MED ORDER — ITRACONAZOLE 100 MG PO CAPS: 200.0000 mg | ORAL_CAPSULE | Freq: Two times a day (BID) | ORAL | 1 refills | Status: DC

## 2018-01-05 NOTE — Progress Notes (Addendum)
  Subjective:  Patient ID: Tammy [name], female    DOB: 10-Mar-1970,  MRN: [id number]  Chief Complaint  Patient presents with  . Results    Review nail fungus results  . Nail Problem    F/U L hallux nail avulsion Pt. states," nail looks like it's trying to grow back the same of ith's a scab." Tx: fluticonazole (completed) and epsom salt   47 y.o. female presents with the above complaint. History as above. States that the right foot felt better for a couple days but came right back.  Review of Systems: Negative except as noted in the HPI. Denies N/V/F/Ch.  Past Medical History:  Diagnosis Date  . Arthritis   . Back pain   . Nail fungus     Current Outpatient Medications:  .  fluconazole (DIFLUCAN) 150 MG tablet, Take 1 tablet (150 mg total) by mouth once a week., Disp: 6 tablet, Rfl: 1 .  itraconazole (SPORANOX) 100 MG capsule, Take 2 capsules (200 mg total) by mouth 2 (two) times daily. For one week. Wait 3 weeks prior to refilling., Disp: 28 capsule, Rfl: 1  Social History   Tobacco Use  Smoking Status Current Every Day Smoker  . Packs/day: 1.00  . Years: 25.00  . Pack years: 25.00  . Types: Cigarettes  Smokeless Tobacco Never Used    Allergies  Allergen Reactions  . No Known Allergies    Objective:   There were no vitals filed for this visit. There is no height or weight on file to calculate BMI. Constitutional Well developed. Well nourished.  Vascular Dorsalis pedis pulses palpable bilaterally. Posterior tibial pulses palpable bilaterally. Capillary refill normal to all digits.  No cyanosis or clubbing noted. Pedal hair growth normal.  Neurologic Normal speech. Oriented to person, place, and time. Epicritic sensation to light touch grossly present bilaterally.  Dermatologic Nails well groomed and normal in appearance. No open wounds. No skin lesions.  Orthopedic: Normal joint ROM without pain or crepitus bilaterally. No visible deformities. No bony  tenderness.   Radiographs: Taken and reviewed. Radiographic Conley Canal sign. Evidence of prior bunioenctomy with intact pins. Elongated 2nd met, decreased 2nd/3rd IMA.  Assessment:   1. Nail fungus   2. Nail dystrophy   3. Encounter for long-term (current) use of high-risk medication   4. Interdigital neuroma    Plan:  Patient was evaluated and treated and all questions answered.  Interdigital Neuroma, right -Interspace injection #2 delivered as below.  Procedure: Neuroma Injection Location: Right 2nd interspace Skin Prep: Alcohol. Injectate: 0.5 cc 0.5% marcaine plain, 0.5 cc dexamethasone phosphate. Disposition: Patient tolerated procedure well. Injection site dressed with a band-aid.  Onychomycosis with Onychomadesis -Nail culture reviewed. Saphrophytic fungi and traumatic changes. -Has failed lamisil in the past, reluctant to try again -Switch to itraconazole. Will pulse dose. Educated on dosing.. -Check LFTs. Will recheck at next visit. -D/c Fluconazole   Return in about 5 weeks (around 02/09/2018) for Nail Fungus.

## 2018-01-06 LAB — HEPATIC FUNCTION PANEL
ALT: 10 IU/L (ref 0–32)
AST: 11 IU/L (ref 0–40)
Albumin: 4.2 g/dL (ref 3.5–5.5)
Alkaline Phosphatase: 52 IU/L (ref 39–117)
Bilirubin Total: 0.3 mg/dL (ref 0.0–1.2)
Bilirubin, Direct: <0.1 mg/dL (ref 0.00–0.40)
TOTAL PROTEIN: 6.2 g/dL (ref 6.0–8.5)

## 2018-02-15 IMAGING — RF DG LUMBAR SPINE 2-3V
1 series · 3 of 3 positions shown · non-contrast
Comparison: None.

CLINICAL DATA: 46-year-old female undergoing multilevel posterior
lumbar interbody fusion

EXAM:
LUMBAR SPINE - 2-3 VIEW; DG C-ARM 61-120 MIN

[Series 1: run · 3 of 3 slices shown]
[im 1/3]
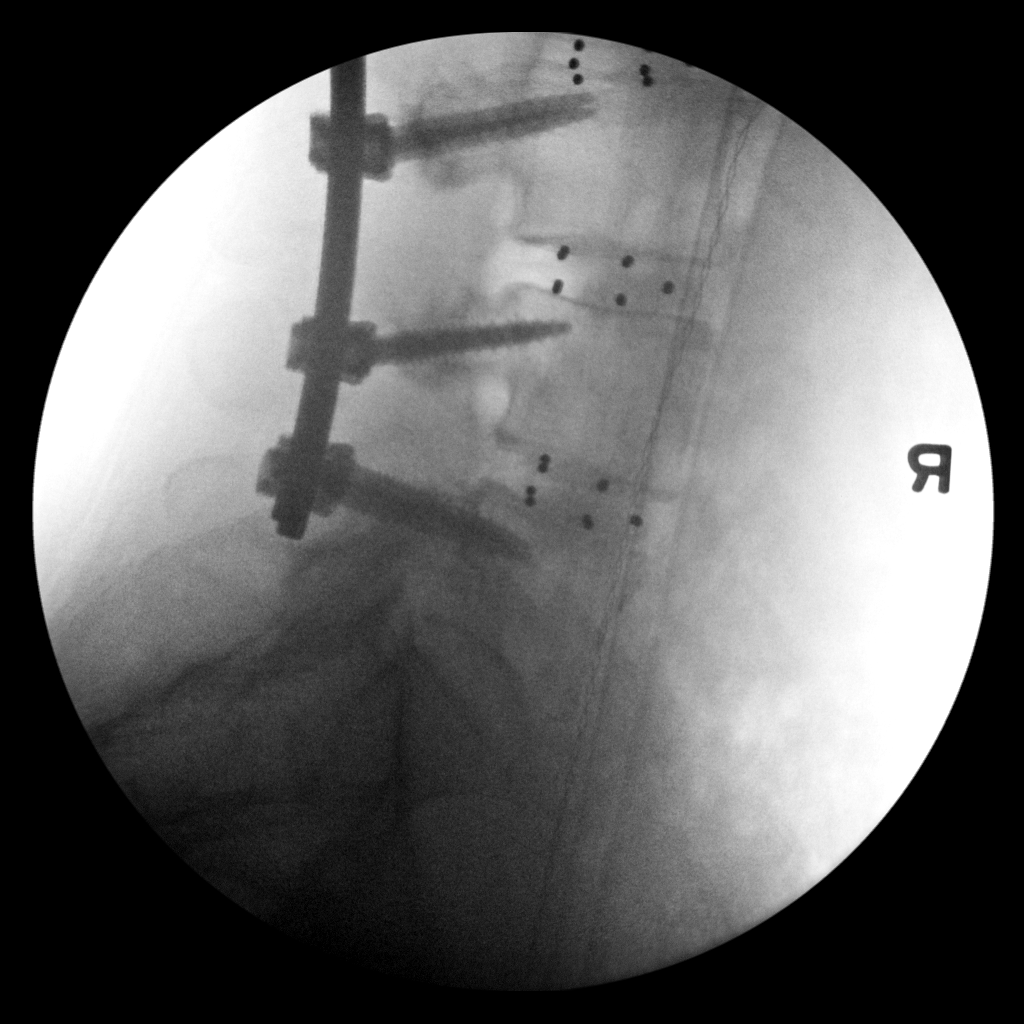
[im 2/3]
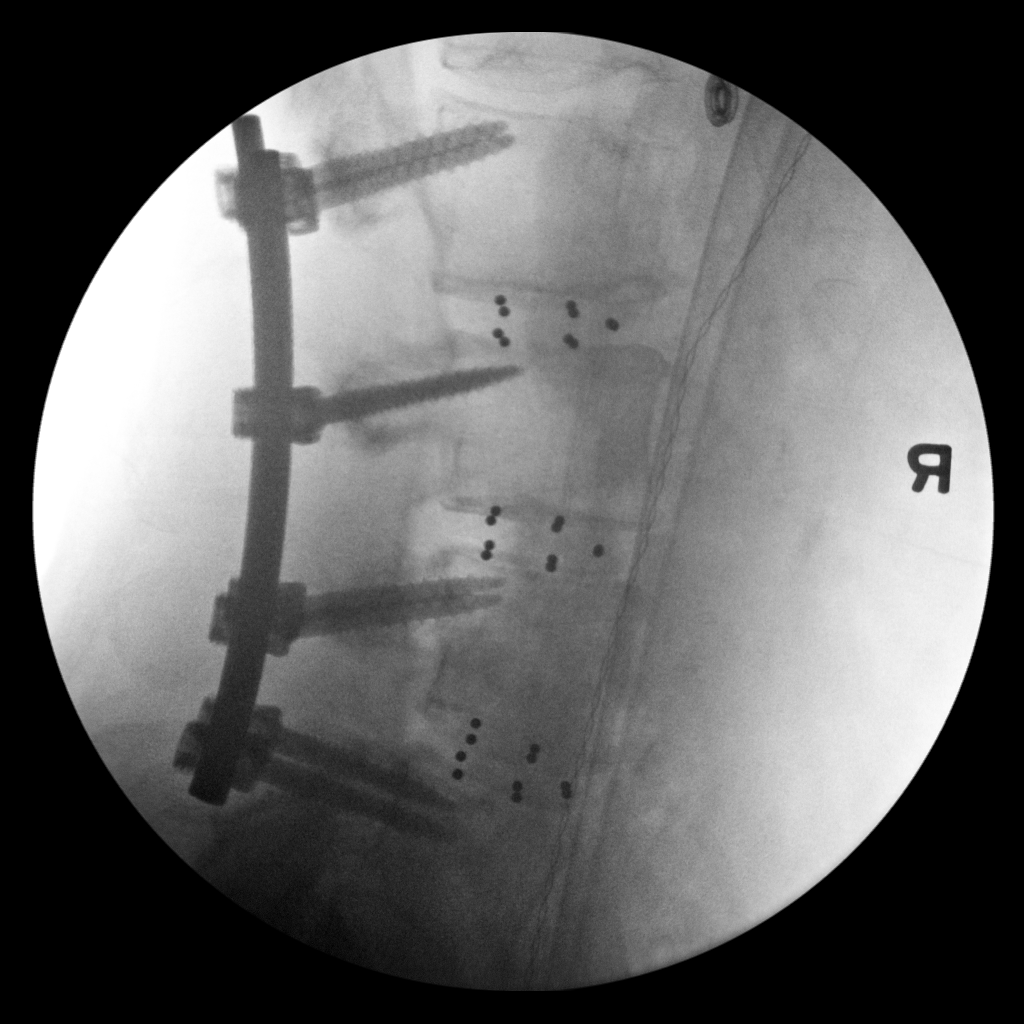
[im 3/3]
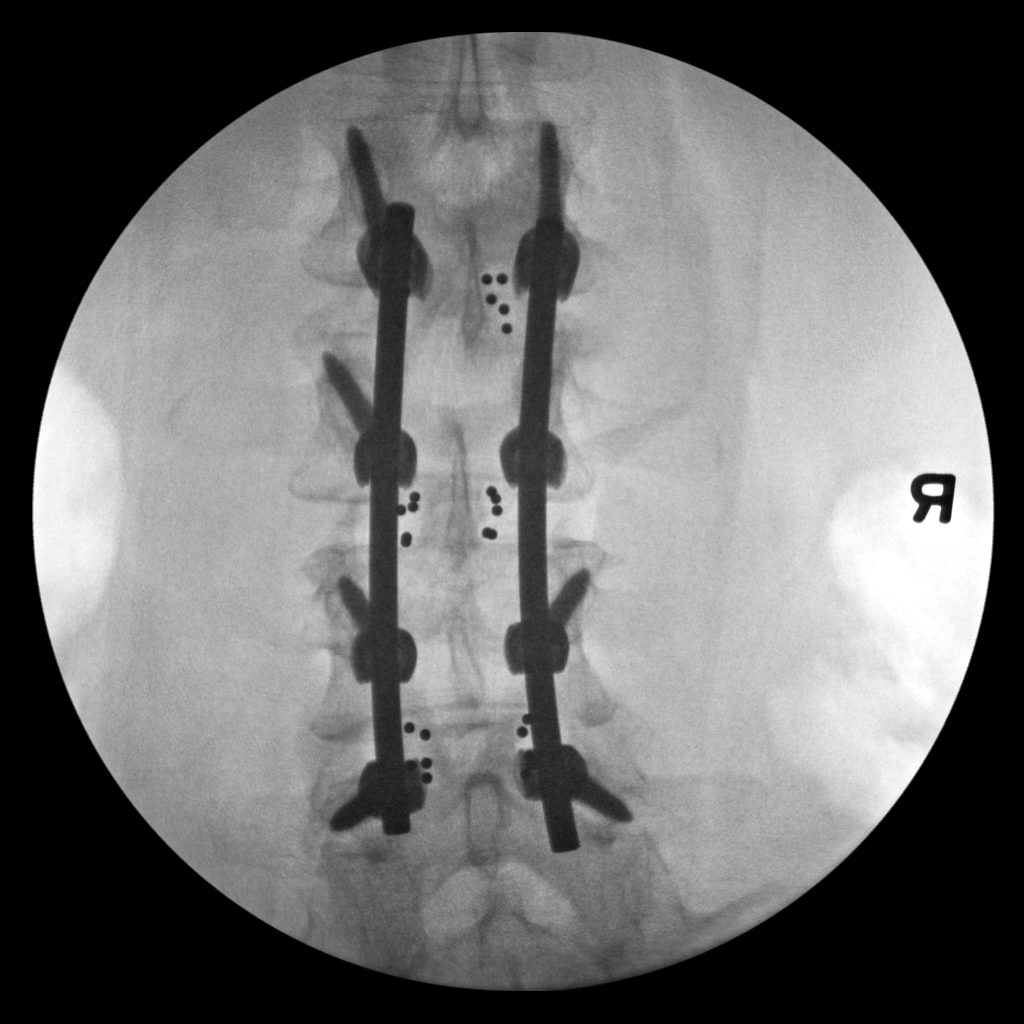

[3 of 3 positions shown; findings below may reference images not displayed]

FINDINGS: AP and cross-table lateral intraoperative images demonstrate
surgical changes of L2-L5 posterior lumbar interbody fusion with
bilateral pedicle screw and rod construct. Interbody grafts are
present at L2-L3, L3-L4, and L4-L5. No evidence of immediate
hardware complication or hardware fracture.
IMPRESSION: Posterior lumbar interbody fusion extending from L2 through L5 with
interbody grafts at each level. No evidence of immediate hardware
complication.

## 2018-02-16 ENCOUNTER — Ambulatory Visit: Payer: 59 | Admitting: Podiatry

## 2018-02-23 ENCOUNTER — Ambulatory Visit (INDEPENDENT_AMBULATORY_CARE_PROVIDER_SITE_OTHER): Payer: 59 | Admitting: Podiatry

## 2018-02-23 DIAGNOSIS — Z5329 Procedure and treatment not carried out because of patient's decision for other reasons: Secondary | ICD-10-CM

## 2018-03-23 ENCOUNTER — Ambulatory Visit: Payer: 59 | Admitting: Podiatry

## 2018-03-23 DIAGNOSIS — B351 Tinea unguium: Secondary | ICD-10-CM

## 2018-03-23 DIAGNOSIS — G588 Other specified mononeuropathies: Secondary | ICD-10-CM

## 2018-03-23 DIAGNOSIS — Z79899 Other long term (current) drug therapy: Secondary | ICD-10-CM | POA: Diagnosis not present

## 2018-03-23 DIAGNOSIS — L608 Other nail disorders: Secondary | ICD-10-CM | POA: Diagnosis not present

## 2018-03-23 NOTE — Progress Notes (Signed)
  Subjective:  Patient ID: Tammy [name], female    DOB: [birth year],  MRN: [id number]  Chief Complaint  Patient presents with  . Nail Problem    F/U nail fungus Pt. states," Lt nail looks like it's growing out. I really haven't been taking the medication as rx." tx: none   48 y.o. female presents with the above complaint. History as above.  States that she did not take the medicine that she was given.  States that the neuromas are feeling better not hurting as much  Review of Systems: Negative except as noted in the HPI. Denies N/V/F/Ch.  Past Medical History:  Diagnosis Date  . Arthritis   . Back pain   . Nail fungus     Current Outpatient Medications:  .  itraconazole (SPORANOX) 100 MG capsule, Take 2 capsules (200 mg total) by mouth 2 (two) times daily. For one week. Wait 3 weeks prior to refilling., Disp: 28 capsule, Rfl: 1  Social History   Tobacco Use  Smoking Status Current Every Day Smoker  . Packs/day: 1.00  . Years: 25.00  . Pack years: 25.00  . Types: Cigarettes  Smokeless Tobacco Never Used    Allergies  Allergen Reactions  . No Known Allergies    Objective:   There were no vitals filed for this visit. There is no height or weight on file to calculate BMI. Constitutional Well developed. Well nourished.  Vascular Dorsalis pedis pulses palpable bilaterally. Posterior tibial pulses palpable bilaterally. Capillary refill normal to all digits.  No cyanosis or clubbing noted. Pedal hair growth normal.  Neurologic Normal speech. Oriented to person, place, and time. Epicritic sensation to light touch grossly present bilaterally.  Dermatologic Nails thickened dystrophic brown discoloration onychomadesis No open wounds. No skin lesions.  Orthopedic: Normal joint ROM without pain or crepitus bilaterally. No visible deformities. No bony tenderness.   Radiographs: None today  Assessment:   1. Encounter for long-term (current) use of high-risk medication     2. Onychomycosis   3. Onychomadesis   4. Interdigital neuroma    Plan:  Patient was evaluated and treated and all questions answered.  Interdigital Neuroma, right -Pain improved. -Declined injection today.   Onychomycosis with Onychomadesis -We will reorder LFTs in 3 weeks.   Advised that she should take the itraconazole as prescribed.   Return in about 2 months (around 05/23/2018) for Nail Fungus.

## 2018-05-25 ENCOUNTER — Ambulatory Visit: Payer: 59 | Admitting: Podiatry

## 2019-02-05 LAB — HM COLONOSCOPY

## 2019-12-02 ENCOUNTER — Other Ambulatory Visit: Payer: Self-pay | Admitting: Neurological Surgery

## 2020-01-12 NOTE — Progress Notes (Addendum)
[id number] Tammy [name], [place] - [address] AT [facility] [address] [name] [address] Phone: [phone] Fax: [phone]  [facility] - [place], [place] - [address] [place] [address] Phone: [phone] Fax: [phone]  [address] [name], [place] - [address] [place] [address] [place] [address] [place] [address] [place] [address] Phone: [phone] Fax: [phone]      Your procedure is scheduled on Monday December 27th.  Report to [facility] Main Entrance "A" at 5:30 A.M., and check in at the Admitting office.  Call this number if you have problems the morning of surgery:  [phone]  Call [phone] if you have any questions prior to your surgery date Monday-Friday 8am-4pm    Remember:  Do not eat or drink anything after midnight the night before your surgery    Take these medicines the morning of surgery with A SIP OF WATER   itraconazole (SPORANOX) 100 MG capsule IF NEEDED  HYDROcodone-acetaminophen (NORCO/VICODIN) 5-325 MG tablet   methocarbamol (ROBAXIN) 750 MG tablet  traMADol (ULTRAM) 50 MG tablet  As of today, STOP taking any Aspirin (unless otherwise instructed by your surgeon) Aleve, Naproxen, Ibuprofen, Motrin, Advil, Goody's, BC's, all herbal medications, fish oil, and all vitamins.                      Do not wear jewelry, make up, or nail polish            Do not wear lotions, powders, perfumes, or deodorant.            Do not shave 48 hours prior to surgery.              Do not bring valuables to the hospital.            [facility] is not responsible for any belongings or valuables.  Do NOT Smoke (Tobacco/Vaping) or drink Alcohol 24 hours prior to your procedure If you use a CPAP at night, you may bring all equipment for your overnight stay.   Contacts, glasses, dentures or bridgework may not be worn into surgery.      For patients admitted to the hospital, discharge time will be  determined by your treatment team.   Patients discharged the day of surgery will not be allowed to drive home, and someone needs to stay with them for 24 hours.    Special instructions:   [facility]- Preparing For Surgery  Before surgery, you can play an important role. Because skin is not sterile, your skin needs to be as free of germs as possible. You can reduce the number of germs on your skin by washing with CHG (chlorahexidine gluconate) Soap before surgery.  CHG is an antiseptic cleaner which kills germs and bonds with the skin to continue killing germs even after washing.    Oral Hygiene is also important to reduce your risk of infection.  Remember - BRUSH YOUR TEETH THE MORNING OF SURGERY WITH YOUR REGULAR TOOTHPASTE  Please do not use if you have an allergy to CHG or antibacterial soaps. If your skin becomes reddened/irritated stop using the CHG.  Do not shave (including legs and underarms) for at least 48 hours prior to first CHG shower. It is OK to shave your face.  Please follow these instructions carefully.   1. Shower the NIGHT BEFORE SURGERY and the MORNING OF SURGERY with CHG Soap.  2. If you chose to wash your hair, wash your hair first as usual with your normal shampoo.  3. After you shampoo, rinse your hair and body thoroughly to remove the shampoo.  4. Use CHG as you would any other liquid soap. You can apply CHG directly to the skin and wash gently with a scrungie or a clean washcloth.   5. Apply the CHG Soap to your body ONLY FROM THE NECK DOWN.  Do not use on open wounds or open sores. Avoid contact with your eyes, ears, mouth and genitals (private parts). Wash Face and genitals (private parts)  with your normal soap.   6. Wash thoroughly, paying special attention to the area where your surgery will be performed.  7. Thoroughly rinse your body with warm water from the neck down.  8. DO NOT shower/wash with your normal soap after using and rinsing off the CHG  Soap.  9. Pat yourself dry with a CLEAN TOWEL.  10. Wear CLEAN PAJAMAS to bed the night before surgery  11. Place CLEAN SHEETS on your bed the night of your first shower and DO NOT SLEEP WITH PETS.   Day of Surgery: Wear Clean/Comfortable clothing the morning of surgery Do not apply any deodorants/lotions.   Remember to brush your teeth WITH YOUR REGULAR TOOTHPASTE.   Please read over the following fact sheets that you were given.

## 2020-01-13 ENCOUNTER — Encounter (HOSPITAL_COMMUNITY)
Admission: RE | Admit: 2020-01-13 | Discharge: 2020-01-13 | Disposition: A | Discharge status: Home / Self Care | Payer: 59 | Source: Ambulatory Visit | Attending: Neurological Surgery | Admitting: Neurological Surgery

## 2020-01-13 ENCOUNTER — Encounter (HOSPITAL_COMMUNITY): Payer: Self-pay

## 2020-01-13 ENCOUNTER — Other Ambulatory Visit: Payer: Self-pay

## 2020-01-13 ENCOUNTER — Ambulatory Visit (HOSPITAL_COMMUNITY)
Admission: RE | Admit: 2020-01-13 | Discharge: 2020-01-13 | Disposition: A | Discharge status: Home / Self Care | Payer: 59 | Source: Ambulatory Visit | Attending: Neurological Surgery | Admitting: Neurological Surgery

## 2020-01-13 ENCOUNTER — Other Ambulatory Visit (HOSPITAL_COMMUNITY)
Admission: RE | Admit: 2020-01-13 | Discharge: 2020-01-13 | Disposition: A | Discharge status: Home / Self Care | Payer: 59 | Source: Ambulatory Visit | Attending: Neurological Surgery | Admitting: Neurological Surgery

## 2020-01-13 DIAGNOSIS — M5126 Other intervertebral disc displacement, lumbar region: Secondary | ICD-10-CM

## 2020-01-13 DIAGNOSIS — Z20822 Contact with and (suspected) exposure to covid-19: Secondary | ICD-10-CM | POA: Diagnosis not present

## 2020-01-13 DIAGNOSIS — Z01818 Encounter for other preprocedural examination: Secondary | ICD-10-CM | POA: Diagnosis not present

## 2020-01-13 LAB — CBC WITH DIFFERENTIAL/PLATELET
Abs Immature Granulocytes: 0.01 10*3/uL (ref 0.00–0.07)
Basophils Absolute: 0 10*3/uL (ref 0.0–0.1)
Basophils Relative: 1 %
Eosinophils Absolute: 0.1 10*3/uL (ref 0.0–0.5)
Eosinophils Relative: 1 %
HCT: 41.2 % (ref 36.0–46.0)
Hemoglobin: 13 g/dL (ref 12.0–15.0)
Immature Granulocytes: 0 %
Lymphocytes Relative: 49 %
Lymphs Abs: 2.6 10*3/uL (ref 0.7–4.0)
MCH: 30.8 pg (ref 26.0–34.0)
MCHC: 31.6 g/dL (ref 30.0–36.0)
MCV: 97.6 fL (ref 80.0–100.0)
Monocytes Absolute: 0.4 10*3/uL (ref 0.1–1.0)
Monocytes Relative: 8 %
Neutro Abs: 2.2 10*3/uL (ref 1.7–7.7)
Neutrophils Relative %: 41 %
Platelets: 245 10*3/uL (ref 150–400)
RBC: 4.22 MIL/uL (ref 3.87–5.11)
RDW: 13.1 % (ref 11.5–15.5)
WBC: 5.4 10*3/uL (ref 4.0–10.5)
nRBC: 0 % (ref 0.0–0.2)

## 2020-01-13 LAB — PROTIME-INR
INR: 0.9 (ref 0.8–1.2)
Prothrombin Time: 12.1 seconds (ref 11.4–15.2)

## 2020-01-13 LAB — BASIC METABOLIC PANEL
Anion gap: 10 (ref 5–15)
BUN: 12 mg/dL (ref 6–20)
CO2: 28 mmol/L (ref 22–32)
Calcium: 9.2 mg/dL (ref 8.9–10.3)
Chloride: 101 mmol/L (ref 98–111)
Creatinine, Ser: 0.78 mg/dL (ref 0.44–1.00)
GFR, Estimated: >60 mL/min (ref >60)
Glucose, Bld: 80 mg/dL (ref 70–99)
Potassium: 4.1 mmol/L (ref 3.5–5.1)
Sodium: 139 mmol/L (ref 135–145)

## 2020-01-13 LAB — TYPE AND SCREEN
ABO/RH(D): O POS
Antibody Screen: NEGATIVE

## 2020-01-13 LAB — SURGICAL PCR SCREEN
MRSA, PCR: NEGATIVE
Staphylococcus aureus: POSITIVE — AB

## 2020-01-13 LAB — SARS CORONAVIRUS 2 (TAT 6-24 HRS): SARS Coronavirus 2: NEGATIVE

## 2020-01-13 NOTE — Progress Notes (Signed)
PCP - Dr. Jinny Sanders with [facility] in [place] Cardiologist - Denies  Chest x-ray - 01/13/20 EKG - 01/13/20 Stress Test - Denies ECHO - Denies Cardiac Cath - Denies  Sleep Study - No OSA   DM - Denies  COVID TEST- 01/13/20   Anesthesia review: No  Patient denies shortness of breath, fever, cough and chest pain at PAT appointment   All instructions explained to the patient, with a verbal understanding of the material. Patient agrees to go over the instructions while at home for a better understanding. Patient also instructed to self quarantine after being tested for COVID-19. The opportunity to ask questions was provided.

## 2020-01-16 NOTE — Anesthesia Preprocedure Evaluation (Addendum)
Anesthesia Evaluation  Patient identified by MRN, date of birth, ID band Patient awake    Reviewed: Allergy & Precautions, NPO status , Patient's Chart, lab work & pertinent test results  History of Anesthesia Complications Negative for: history of anesthetic complications  Airway Mallampati: I  TM Distance: >3 FB Neck ROM: Full    Dental  (+) Teeth Intact, Dental Advisory Given   Pulmonary Patient did not abstain from smoking., former smoker,    Pulmonary exam normal        Cardiovascular negative cardio ROS   Rhythm:Regular Rate:Normal     Neuro/Psych negative psych ROS   GI/Hepatic negative GI ROS, Neg liver ROS,   Endo/Other  negative endocrine ROS  Renal/GU negative Renal ROS  negative genitourinary   Musculoskeletal  (+) Arthritis , Osteoarthritis,    Abdominal Normal abdominal exam  (+)   Peds negative pediatric ROS (+) Congenital Heart Disease Hematology negative hematology ROS (+)   Anesthesia Other Findings   Reproductive/Obstetrics negative OB ROS                            Lab Results  Component Value Date   WBC 5.4 01/13/2020   HGB 13.0 01/13/2020   HCT 41.2 01/13/2020   MCV 97.6 01/13/2020   PLT 245 01/13/2020   Lab Results  Component Value Date   CREATININE 0.78 01/13/2020   BUN 12 01/13/2020   NA 139 01/13/2020   K 4.1 01/13/2020   CL 101 01/13/2020   CO2 28 01/13/2020   Lab Results  Component Value Date   INR 0.9 01/13/2020   INR 0.92 06/27/2016   EKG: normal sinus rhythm.  Anesthesia Physical  Anesthesia Plan  ASA: II  Anesthesia Plan: General   Post-op Pain Management:    Induction: Intravenous  PONV Risk Score and Plan: 4 or greater and Ondansetron, Dexamethasone, Propofol, Midazolam and Scopolamine patch - Pre-op  Airway Management Planned: Oral ETT  Additional Equipment:   Intra-op Plan:   Post-operative Plan: Extubation in  OR  Informed Consent: I have reviewed the patients History and Physical, chart, labs and discussed the procedure including the risks, benefits and alternatives for the proposed anesthesia with the patient or authorized representative who has indicated his/her understanding and acceptance.     Dental advisory given  Plan Discussed with: CRNA and Anesthesiologist  Anesthesia Plan Comments:        Anesthesia Quick Evaluation

## 2020-01-17 ENCOUNTER — Ambulatory Visit (HOSPITAL_COMMUNITY): Payer: 59

## 2020-01-17 ENCOUNTER — Other Ambulatory Visit: Payer: Self-pay

## 2020-01-17 ENCOUNTER — Encounter (HOSPITAL_COMMUNITY)
Admission: RE | Disposition: A | Discharge status: Home / Self Care | Payer: Self-pay | Source: Ambulatory Visit | Attending: Neurological Surgery

## 2020-01-17 ENCOUNTER — Encounter (HOSPITAL_COMMUNITY): Payer: Self-pay | Admitting: Neurological Surgery

## 2020-01-17 ENCOUNTER — Ambulatory Visit (HOSPITAL_COMMUNITY)
Admission: RE | Admit: 2020-01-17 | Discharge: 2020-01-18 | Disposition: A | Discharge status: Home / Self Care | Payer: 59 | Source: Ambulatory Visit | Attending: Neurological Surgery | Admitting: Neurological Surgery

## 2020-01-17 ENCOUNTER — Ambulatory Visit (HOSPITAL_COMMUNITY): Payer: 59 | Admitting: Certified Registered"

## 2020-01-17 DIAGNOSIS — Z79899 Other long term (current) drug therapy: Secondary | ICD-10-CM | POA: Diagnosis not present

## 2020-01-17 DIAGNOSIS — M5126 Other intervertebral disc displacement, lumbar region: Secondary | ICD-10-CM | POA: Insufficient documentation

## 2020-01-17 DIAGNOSIS — Z981 Arthrodesis status: Secondary | ICD-10-CM

## 2020-01-17 DIAGNOSIS — Z419 Encounter for procedure for purposes other than remedying health state, unspecified: Secondary | ICD-10-CM

## 2020-01-17 DIAGNOSIS — M4807 Spinal stenosis, lumbosacral region: Secondary | ICD-10-CM | POA: Diagnosis not present

## 2020-01-17 DIAGNOSIS — M5137 Other intervertebral disc degeneration, lumbosacral region: Secondary | ICD-10-CM | POA: Insufficient documentation

## 2020-01-17 DIAGNOSIS — Z20822 Contact with and (suspected) exposure to covid-19: Secondary | ICD-10-CM | POA: Insufficient documentation

## 2020-01-17 DIAGNOSIS — M25552 Pain in left hip: Secondary | ICD-10-CM | POA: Diagnosis not present

## 2020-01-17 DIAGNOSIS — M25551 Pain in right hip: Secondary | ICD-10-CM | POA: Diagnosis not present

## 2020-01-17 DIAGNOSIS — Z87891 Personal history of nicotine dependence: Secondary | ICD-10-CM | POA: Insufficient documentation

## 2020-01-17 HISTORY — DX: Arthrodesis status: Z98.1

## 2020-01-17 LAB — SARS CORONAVIRUS 2 BY RT PCR (HOSPITAL ORDER, PERFORMED IN ~~LOC~~ HOSPITAL LAB): SARS Coronavirus 2: NEGATIVE

## 2020-01-17 SURGERY — POSTERIOR LUMBAR FUSION 1 LEVEL
Anesthesia: General | Site: Back

## 2020-01-17 MED ORDER — OXYCODONE HCL 5 MG PO TABS
5.0000 mg | ORAL_TABLET | ORAL | Status: DC | PRN
  Administered 2020-01-17: 5 mg via ORAL
  Administered 2020-01-17 – 2020-01-18 (×4): 10 mg via ORAL
  Filled 2020-01-17: qty 2
  Filled 2020-01-17: qty 1
  Filled 2020-01-17 (×3): qty 2

## 2020-01-17 MED ORDER — ONDANSETRON HCL 4 MG/2ML IJ SOLN
INTRAMUSCULAR | Status: DC | PRN
  Administered 2020-01-17: 4 mg via INTRAVENOUS

## 2020-01-17 MED ORDER — SUGAMMADEX SODIUM 200 MG/2ML IV SOLN
INTRAVENOUS | Status: DC | PRN
  Administered 2020-01-17: 200 mg via INTRAVENOUS

## 2020-01-17 MED ORDER — GLYCOPYRROLATE 0.2 MG/ML IJ SOLN
INTRAMUSCULAR | Status: DC | PRN
  Administered 2020-01-17: .2 mg via INTRAVENOUS

## 2020-01-17 MED ORDER — ACETAMINOPHEN 325 MG PO TABS
650.0000 mg | ORAL_TABLET | ORAL | Status: DC | PRN
  Administered 2020-01-18: 650 mg via ORAL
  Filled 2020-01-17: qty 2

## 2020-01-17 MED ORDER — ONDANSETRON HCL 4 MG/2ML IJ SOLN: 4.0000 mg | Freq: Four times a day (QID) | INTRAMUSCULAR | Status: DC | PRN

## 2020-01-17 MED ORDER — SCOPOLAMINE 1 MG/3DAYS TD PT72
1.0000 | MEDICATED_PATCH | TRANSDERMAL | Status: DC
  Administered 2020-01-17: 06:00:00 1.5 mg via TRANSDERMAL

## 2020-01-17 MED ORDER — MENTHOL 3 MG MT LOZG: 1.0000 | LOZENGE | OROMUCOSAL | Status: DC | PRN

## 2020-01-17 MED ORDER — THROMBIN 5000 UNITS EX SOLR
OROMUCOSAL | Status: DC | PRN
  Administered 2020-01-17: 09:00:00 5 mL via TOPICAL

## 2020-01-17 MED ORDER — CELECOXIB 200 MG PO CAPS
200.0000 mg | ORAL_CAPSULE | Freq: Two times a day (BID) | ORAL | Status: DC
  Administered 2020-01-17 – 2020-01-18 (×3): 200 mg via ORAL
  Filled 2020-01-17 (×3): qty 1

## 2020-01-17 MED ORDER — METHOCARBAMOL 750 MG PO TABS
750.0000 mg | ORAL_TABLET | Freq: Three times a day (TID) | ORAL | Status: DC | PRN
  Administered 2020-01-17 – 2020-01-18 (×3): 750 mg via ORAL
  Filled 2020-01-17 (×2): qty 1

## 2020-01-17 MED ORDER — ROCURONIUM 10MG/ML (10ML) SYRINGE FOR MEDFUSION PUMP - OPTIME
INTRAVENOUS | Status: DC | PRN
  Administered 2020-01-17: 100 mg via INTRAVENOUS

## 2020-01-17 MED ORDER — HEPARIN SODIUM (PORCINE) 1000 UNIT/ML IJ SOLN
INTRAMUSCULAR | Status: DC | PRN
  Administered 2020-01-17: 10000 [IU]

## 2020-01-17 MED ORDER — DEXAMETHASONE SODIUM PHOSPHATE 4 MG/ML IJ SOLN
4.0000 mg | Freq: Four times a day (QID) | INTRAMUSCULAR | Status: DC
  Administered 2020-01-17 – 2020-01-18 (×3): 4 mg via INTRAVENOUS
  Filled 2020-01-17 (×3): qty 1

## 2020-01-17 MED ORDER — 0.9 % SODIUM CHLORIDE (POUR BTL) OPTIME
TOPICAL | Status: DC | PRN
  Administered 2020-01-17: 09:00:00 1000 mL

## 2020-01-17 MED ORDER — LIDOCAINE HCL (CARDIAC) PF 100 MG/5ML IV SOSY
PREFILLED_SYRINGE | INTRAVENOUS | Status: DC | PRN
  Administered 2020-01-17: 100 mg via INTRAVENOUS

## 2020-01-17 MED ORDER — LACTATED RINGERS IV SOLN: INTRAVENOUS | Status: DC

## 2020-01-17 MED ORDER — THROMBIN 20000 UNITS EX SOLR
CUTANEOUS | Status: DC | PRN
  Administered 2020-01-17: 10:00:00 20 mL via TOPICAL

## 2020-01-17 MED ORDER — ALBUMIN HUMAN 5 % IV SOLN: INTRAVENOUS | Status: DC | PRN

## 2020-01-17 MED ORDER — SODIUM CHLORIDE 0.9% FLUSH: 3.0000 mL | INTRAVENOUS | Status: DC | PRN

## 2020-01-17 MED ORDER — SODIUM CHLORIDE 0.9% FLUSH
3.0000 mL | Freq: Two times a day (BID) | INTRAVENOUS | Status: DC
  Administered 2020-01-17 (×2): 3 mL via INTRAVENOUS

## 2020-01-17 MED ORDER — CHLORHEXIDINE GLUCONATE CLOTH 2 % EX PADS: 6.0000 | MEDICATED_PAD | Freq: Once | CUTANEOUS | Status: DC

## 2020-01-17 MED ORDER — CHLORHEXIDINE GLUCONATE 0.12 % MT SOLN
15.0000 mL | Freq: Once | OROMUCOSAL | Status: AC
  Administered 2020-01-17: 06:00:00 15 mL via OROMUCOSAL

## 2020-01-17 MED ORDER — POTASSIUM CHLORIDE IN NACL 20-0.9 MEQ/L-% IV SOLN: INTRAVENOUS | Status: DC

## 2020-01-17 MED ORDER — SENNA 8.6 MG PO TABS
1.0000 | ORAL_TABLET | Freq: Two times a day (BID) | ORAL | Status: DC
  Administered 2020-01-17: 14:00:00 8.6 mg via ORAL
  Filled 2020-01-17: qty 1

## 2020-01-17 MED ORDER — OXYCODONE HCL 5 MG PO TABS
ORAL_TABLET | ORAL | Status: AC
  Filled 2020-01-17: qty 1

## 2020-01-17 MED ORDER — LACTATED RINGERS IV SOLN: INTRAVENOUS | Status: DC | PRN

## 2020-01-17 MED ORDER — GABAPENTIN 300 MG PO CAPS
300.0000 mg | ORAL_CAPSULE | ORAL | Status: AC
  Administered 2020-01-17: 06:00:00 300 mg via ORAL

## 2020-01-17 MED ORDER — PROMETHAZINE HCL 25 MG/ML IJ SOLN: 6.2500 mg | INTRAMUSCULAR | Status: DC | PRN

## 2020-01-17 MED ORDER — ORAL CARE MOUTH RINSE: 15.0000 mL | Freq: Once | OROMUCOSAL | Status: AC

## 2020-01-17 MED ORDER — METHOCARBAMOL 500 MG PO TABS
ORAL_TABLET | ORAL | Status: AC
  Filled 2020-01-17: qty 2

## 2020-01-17 MED ORDER — FENTANYL CITRATE (PF) 100 MCG/2ML IJ SOLN
INTRAMUSCULAR | Status: AC
  Filled 2020-01-17: qty 2

## 2020-01-17 MED ORDER — OXYCODONE HCL 5 MG PO TABS
5.0000 mg | ORAL_TABLET | ORAL | Status: DC | PRN
  Administered 2020-01-17: 5 mg via ORAL

## 2020-01-17 MED ORDER — THROMBIN 20000 UNITS EX SOLR
CUTANEOUS | Status: AC
  Filled 2020-01-17: qty 20000

## 2020-01-17 MED ORDER — HEPARIN SODIUM (PORCINE) 1000 UNIT/ML IJ SOLN
INTRAMUSCULAR | Status: AC
  Filled 2020-01-17: qty 1

## 2020-01-17 MED ORDER — PHENOL 1.4 % MT LIQD: 1.0000 | OROMUCOSAL | Status: DC | PRN

## 2020-01-17 MED ORDER — SENNOSIDES-DOCUSATE SODIUM 8.6-50 MG PO TABS: 1.0000 | ORAL_TABLET | Freq: Every evening | ORAL | Status: DC | PRN

## 2020-01-17 MED ORDER — SODIUM CHLORIDE (PF) 0.9 % IJ SOLN
INTRAMUSCULAR | Status: DC | PRN
  Administered 2020-01-17: 10 mL

## 2020-01-17 MED ORDER — PHENYLEPHRINE HCL-NACL 10-0.9 MG/250ML-% IV SOLN
INTRAVENOUS | Status: DC | PRN
  Administered 2020-01-17: 50 ug/min via INTRAVENOUS

## 2020-01-17 MED ORDER — FENTANYL CITRATE (PF) 100 MCG/2ML IJ SOLN
25.0000 ug | INTRAMUSCULAR | Status: DC | PRN
  Administered 2020-01-17: 25 ug via INTRAVENOUS
  Administered 2020-01-17: 50 ug via INTRAVENOUS
  Administered 2020-01-17: 25 ug via INTRAVENOUS

## 2020-01-17 MED ORDER — ACETAMINOPHEN 500 MG PO TABS
1000.0000 mg | ORAL_TABLET | Freq: Once | ORAL | Status: AC
  Administered 2020-01-17: 12:00:00 1000 mg via ORAL

## 2020-01-17 MED ORDER — CEFAZOLIN SODIUM-DEXTROSE 2-4 GM/100ML-% IV SOLN
2.0000 g | Freq: Three times a day (TID) | INTRAVENOUS | Status: AC
  Administered 2020-01-17 – 2020-01-18 (×2): 2 g via INTRAVENOUS
  Filled 2020-01-17 (×2): qty 100

## 2020-01-17 MED ORDER — BUPIVACAINE HCL (PF) 0.25 % IJ SOLN
INTRAMUSCULAR | Status: AC
  Filled 2020-01-17: qty 30

## 2020-01-17 MED ORDER — ACETAMINOPHEN 650 MG RE SUPP: 650.0000 mg | RECTAL | Status: DC | PRN

## 2020-01-17 MED ORDER — DEXAMETHASONE SODIUM PHOSPHATE 10 MG/ML IJ SOLN
10.0000 mg | Freq: Once | INTRAMUSCULAR | Status: AC
  Administered 2020-01-17: 09:00:00 10 mg via INTRAVENOUS

## 2020-01-17 MED ORDER — SUFENTANIL CITRATE 50 MCG/ML IV SOLN
INTRAVENOUS | Status: DC | PRN
  Administered 2020-01-17: 20 ug via INTRAVENOUS

## 2020-01-17 MED ORDER — ARTHREX ANGEL - ACD-A SOLUTION (CHARTING ONLY) OPTIME
TOPICAL | Status: DC | PRN
  Administered 2020-01-17: 10:00:00 10 mL via TOPICAL

## 2020-01-17 MED ORDER — SODIUM CHLORIDE 0.9 % IV SOLN
250.0000 mL | INTRAVENOUS | Status: DC
  Administered 2020-01-17: 14:00:00 250 mL via INTRAVENOUS

## 2020-01-17 MED ORDER — ACETAMINOPHEN 10 MG/ML IV SOLN
INTRAVENOUS | Status: AC
  Administered 2020-01-17: 12:00:00 1000 mg
  Filled 2020-01-17: qty 100

## 2020-01-17 MED ORDER — MIDAZOLAM HCL 2 MG/2ML IJ SOLN
INTRAMUSCULAR | Status: DC | PRN
  Administered 2020-01-17: 2 mg via INTRAVENOUS

## 2020-01-17 MED ORDER — BUPIVACAINE HCL (PF) 0.25 % IJ SOLN
INTRAMUSCULAR | Status: DC | PRN
  Administered 2020-01-17: 8 mL

## 2020-01-17 MED ORDER — THROMBIN 5000 UNITS EX SOLR
CUTANEOUS | Status: AC
  Filled 2020-01-17: qty 5000

## 2020-01-17 MED ORDER — MORPHINE SULFATE (PF) 2 MG/ML IV SOLN
2.0000 mg | INTRAVENOUS | Status: DC | PRN
  Administered 2020-01-17: 2 mg via INTRAVENOUS
  Filled 2020-01-17: qty 1

## 2020-01-17 MED ORDER — PHENYLEPHRINE HCL (PRESSORS) 10 MG/ML IV SOLN
INTRAVENOUS | Status: DC | PRN
  Administered 2020-01-17 (×3): 80 ug via INTRAVENOUS

## 2020-01-17 MED ORDER — ACETAMINOPHEN 500 MG PO TABS
1000.0000 mg | ORAL_TABLET | ORAL | Status: AC
  Administered 2020-01-17: 06:00:00 1000 mg via ORAL

## 2020-01-17 MED ORDER — ONDANSETRON HCL 4 MG PO TABS: 4.0000 mg | ORAL_TABLET | Freq: Four times a day (QID) | ORAL | Status: DC | PRN

## 2020-01-17 MED ORDER — DEXAMETHASONE 4 MG PO TABS
4.0000 mg | ORAL_TABLET | Freq: Four times a day (QID) | ORAL | Status: DC
  Administered 2020-01-17 – 2020-01-18 (×2): 4 mg via ORAL
  Filled 2020-01-17 (×2): qty 1

## 2020-01-17 MED ORDER — PROPOFOL 10 MG/ML IV BOLUS
INTRAVENOUS | Status: DC | PRN
  Administered 2020-01-17: 30 mg via INTRAVENOUS
  Administered 2020-01-17: 100 mg via INTRAVENOUS

## 2020-01-17 MED ORDER — CEFAZOLIN SODIUM-DEXTROSE 2-4 GM/100ML-% IV SOLN
2.0000 g | INTRAVENOUS | Status: AC
  Administered 2020-01-17: 09:00:00 2 g via INTRAVENOUS

## 2020-01-17 MED ORDER — CELECOXIB 200 MG PO CAPS
200.0000 mg | ORAL_CAPSULE | Freq: Once | ORAL | Status: AC
  Administered 2020-01-17: 06:00:00 200 mg via ORAL

## 2020-01-17 SURGICAL SUPPLY — 64 items
BASKET BONE COLLECTION (BASKET) ×3 IMPLANT
BENZOIN TINCTURE PRP APPL 2/3 (GAUZE/BANDAGES/DRESSINGS) ×3 IMPLANT
BIT DRILL PLIF MAS DISP 5.5MM (DRILL) ×1 IMPLANT
BLADE CLIPPER SURG (BLADE) IMPLANT
BONE MATRIX OSTEOCEL PRO MED (Bone Implant) ×3 IMPLANT
BUR CARBIDE MATCH 3.0 (BURR) ×3 IMPLANT
CAGE COROENT MP 8X9X23M-8 SPIN (Cage) ×6 IMPLANT
CANISTER SUCT 3000ML PPV (MISCELLANEOUS) ×3 IMPLANT
CLOSURE STERI-STRIP 1/2X4 (GAUZE/BANDAGES/DRESSINGS) ×1
CLOSURE WOUND 1/2 X4 (GAUZE/BANDAGES/DRESSINGS) ×2
CLSR STERI-STRIP ANTIMIC 1/2X4 (GAUZE/BANDAGES/DRESSINGS) ×2 IMPLANT
CNTNR URN SCR LID CUP LEK RST (MISCELLANEOUS) ×1 IMPLANT
CONT SPEC 4OZ STRL OR WHT (MISCELLANEOUS) ×2
COVER BACK TABLE 60X90IN (DRAPES) ×3 IMPLANT
COVER WAND RF STERILE (DRAPES) ×3 IMPLANT
DERMABOND ADVANCED (GAUZE/BANDAGES/DRESSINGS) ×2
DERMABOND ADVANCED .7 DNX12 (GAUZE/BANDAGES/DRESSINGS) ×1 IMPLANT
DIFFUSER DRILL AIR PNEUMATIC (MISCELLANEOUS) IMPLANT
DRAPE C-ARM 42X72 X-RAY (DRAPES) ×3 IMPLANT
DRAPE C-ARMOR (DRAPES) ×3 IMPLANT
DRAPE LAPAROTOMY 100X72X124 (DRAPES) ×3 IMPLANT
DRAPE SURG 17X23 STRL (DRAPES) ×3 IMPLANT
DRILL PLIF MAS DISP 5.5MM (DRILL) ×3
DRSG OPSITE POSTOP 4X8 (GAUZE/BANDAGES/DRESSINGS) ×3 IMPLANT
DURAPREP 26ML APPLICATOR (WOUND CARE) ×3 IMPLANT
ELECT REM PT RETURN 9FT ADLT (ELECTROSURGICAL) ×3
ELECTRODE REM PT RTRN 9FT ADLT (ELECTROSURGICAL) ×1 IMPLANT
EVACUATOR 1/8 PVC DRAIN (DRAIN) IMPLANT
GAUZE 4X4 16PLY RFD (DISPOSABLE) IMPLANT
GLOVE BIO SURGEON STRL SZ7 (GLOVE) ×6 IMPLANT
GLOVE BIO SURGEON STRL SZ8 (GLOVE) ×6 IMPLANT
GLOVE BIOGEL PI IND STRL 7.0 (GLOVE) ×1 IMPLANT
GLOVE BIOGEL PI INDICATOR 7.0 (GLOVE) ×2
GLOVE SURG SS PI 7.0 STRL IVOR (GLOVE) ×18 IMPLANT
GLOVE SURG SS PI 7.5 STRL IVOR (GLOVE) ×6 IMPLANT
GOWN STRL REUS W/ TWL LRG LVL3 (GOWN DISPOSABLE) ×1 IMPLANT
GOWN STRL REUS W/ TWL XL LVL3 (GOWN DISPOSABLE) ×4 IMPLANT
GOWN STRL REUS W/TWL 2XL LVL3 (GOWN DISPOSABLE) IMPLANT
GOWN STRL REUS W/TWL LRG LVL3 (GOWN DISPOSABLE) ×2
GOWN STRL REUS W/TWL XL LVL3 (GOWN DISPOSABLE) ×8
HEMOSTAT POWDER KIT SURGIFOAM (HEMOSTASIS) ×3 IMPLANT
KIT BASIN OR (CUSTOM PROCEDURE TRAY) ×3 IMPLANT
KIT BONE MRW ASP ANGEL CPRP (KITS) ×3 IMPLANT
KIT TURNOVER KIT B (KITS) ×3 IMPLANT
MILL MEDIUM DISP (BLADE) IMPLANT
NEEDLE HYPO 25X1 1.5 SAFETY (NEEDLE) ×3 IMPLANT
NS IRRIG 1000ML POUR BTL (IV SOLUTION) ×3 IMPLANT
PACK LAMINECTOMY NEURO (CUSTOM PROCEDURE TRAY) ×3 IMPLANT
PAD ARMBOARD 7.5X6 YLW CONV (MISCELLANEOUS) ×9 IMPLANT
RASP 3.0MM (RASP) ×3 IMPLANT
ROD RELINE COCR 5.0X60MM (Rod) ×6 IMPLANT
SCREW LOCK RSS 4.5/5.0MM (Screw) ×18 IMPLANT
SCREW RELINE RMM 6.5X35 4S (Screw) ×6 IMPLANT
SPONGE LAP 4X18 RFD (DISPOSABLE) IMPLANT
SPONGE SURGIFOAM ABS GEL 100 (HEMOSTASIS) ×3 IMPLANT
STRIP CLOSURE SKIN 1/2X4 (GAUZE/BANDAGES/DRESSINGS) ×4 IMPLANT
SUT VIC AB 0 CT1 18XCR BRD8 (SUTURE) ×1 IMPLANT
SUT VIC AB 0 CT1 8-18 (SUTURE) ×2
SUT VIC AB 2-0 CP2 18 (SUTURE) ×6 IMPLANT
SUT VIC AB 3-0 SH 8-18 (SUTURE) ×9 IMPLANT
TOWEL GREEN STERILE (TOWEL DISPOSABLE) ×3 IMPLANT
TOWEL GREEN STERILE FF (TOWEL DISPOSABLE) ×3 IMPLANT
TRAY FOLEY MTR SLVR 16FR STAT (SET/KITS/TRAYS/PACK) ×3 IMPLANT
WATER STERILE IRR 1000ML POUR (IV SOLUTION) ×3 IMPLANT

## 2020-01-17 NOTE — Anesthesia Postprocedure Evaluation (Signed)
Anesthesia Post Note  Patient: Tammy [name]  Procedure(s) Performed: POSTERIOR LUMBAR INTERBODY FUSION - LUMBAR FIVE-SACRAL ONE with extension of hardware (N/A Back)     Patient location during evaluation: PACU Anesthesia Type: General Level of consciousness: sedated Pain management: pain level controlled Vital Signs Assessment: post-procedure vital signs reviewed and stable Respiratory status: spontaneous breathing and respiratory function stable Cardiovascular status: stable Postop Assessment: no apparent nausea or vomiting Anesthetic complications: no   No complications documented.  Last Vitals:  Vitals:   01/17/20 1131 01/17/20 1146  BP: (!) 146/99 (!) 147/85  Pulse: 80 82  Resp: 20 18  Temp: (!) 36.1 C   SpO2: 99% 97%    Last Pain:  Vitals:   01/17/20 1146  TempSrc:   PainSc: 6                  Kieli Golladay DANIEL

## 2020-01-17 NOTE — H&P (Signed)
Subjective: Patient is a 49 y.o. female admitted for back pain. Onset of symptoms was several months ago, gradually worsening since that time.  The pain is rated severe, and is located at the across the lower back and radiates to hips. The pain is described as aching and occurs all day. The symptoms have been progressive. Symptoms are exacerbated by exercise and standing. MRI or CT showed adjacent level dz L5-S1    Past Medical History:  Diagnosis Date  . Arthritis   . Back pain   . Nail fungus     Past Surgical History:  Procedure Laterality Date  . ABDOMINAL HYSTERECTOMY    . BACK SURGERY    . BUNIONECTOMY     RIGHT FOOT  . MAXIMUM ACCESS (MAS)POSTERIOR LUMBAR INTERBODY FUSION (PLIF) 3 LEVEL N/A 07/10/2016   Procedure: Lumbar two-Lumbar three - Lumbar three-Lumbar four - Lumbar four-Lumbar five MAXIMUM ACCESS (MAS) POSTERIOR LUMBAR INTERBODY FUSION;  Surgeon: Tia Alert, MD;  Location: [facility] OR;  Service: Neurosurgery;  Laterality: N/A;  . TONSILLECTOMY    . TUBAL LIGATION      Prior to Admission medications   Medication Sig Start Date End Date Taking? Authorizing Provider  HYDROcodone-acetaminophen (NORCO/VICODIN) 5-325 MG tablet Take 1 tablet by mouth every 6 (six) hours as needed for severe pain. 12/14/19  Yes [provider]  methocarbamol (ROBAXIN) 750 MG tablet Take 750 mg by mouth every 8 (eight) hours as needed for muscle spasms. 12/02/19  Yes [provider]  traMADol (ULTRAM) 50 MG tablet Take 50 mg by mouth 4 (four) times daily as needed. 09/09/19  Yes [provider]  itraconazole (SPORANOX) 100 MG capsule Take 2 capsules (200 mg total) by mouth 2 (two) times daily. For one week. Wait 3 weeks prior to refilling. 01/05/18   Park Liter, DPM   Allergies  Allergen Reactions  . No Known Allergies     Social History   Tobacco Use  . Smoking status: Former Smoker    Packs/day: 1.00    Years: 25.00    Pack years: 25.00    Types: Cigarettes     Quit date: 2018    Years since quitting: 3.9  . Smokeless tobacco: Never Used  Substance Use Topics  . Alcohol use: Yes    Alcohol/week: 10.0 standard drinks    Types: 10 Cans of beer per week    Comment: SOCIAL    History reviewed. No pertinent family history.   Review of Systems  Positive ROS: neg  All other systems have been reviewed and were otherwise negative with the exception of those mentioned in the HPI and as above.  Objective: Vital signs in last 24 hours: Temp:  [97.7 F (36.5 C)] 97.7 F (36.5 C) (12/27 0558) Pulse Rate:  [62] 62 (12/27 0558) Resp:  [18] 18 (12/27 0558) BP: (177)/(97) 177/97 (12/27 0558) SpO2:  [98 %] 98 % (12/27 0558) Weight:  [77.1 kg] 77.1 kg (12/27 0558)  General Appearance: Alert, cooperative, no distress, appears stated age Head: Normocephalic, without obvious abnormality, atraumatic Eyes: PERRL, conjunctiva/corneas clear, EOM's intact    Neck: Supple, symmetrical, trachea midline Back: Symmetric, no curvature, ROM normal, no CVA tenderness Lungs:  respirations unlabored Heart: Regular rate and rhythm Abdomen: Soft, non-tender Extremities: Extremities normal, atraumatic, no cyanosis or edema Pulses: 2+ and symmetric all extremities Skin: Skin color, texture, turgor normal, no rashes or lesions  NEUROLOGIC:   Mental status: Alert and oriented x4,  no aphasia, good attention span, fund  of knowledge, and memory Motor Exam - grossly normal Sensory Exam - grossly normal Reflexes: 1+ Coordination - grossly normal Gait - grossly normal Balance - grossly normal Cranial Nerves: I: smell Not tested  II: visual acuity  OS: nl    OD: nl  II: visual fields Full to confrontation  II: pupils Equal, round, reactive to light  III,VII: ptosis None  III,IV,VI: extraocular muscles  Full ROM  V: mastication Normal  V: facial light touch sensation  Normal  V,VII: corneal reflex  Present  VII: facial muscle function - upper  Normal  VII:  facial muscle function - lower Normal  VIII: hearing Not tested  IX: soft palate elevation  Normal  IX,X: gag reflex Present  XI: trapezius strength  5/5  XI: sternocleidomastoid strength 5/5  XI: neck flexion strength  5/5  XII: tongue strength  Normal    Data Review Lab Results  Component Value Date   WBC 5.4 01/13/2020   HGB 13.0 01/13/2020   HCT 41.2 01/13/2020   MCV 97.6 01/13/2020   PLT 245 01/13/2020   Lab Results  Component Value Date   NA 139 01/13/2020   K 4.1 01/13/2020   CL 101 01/13/2020   CO2 28 01/13/2020   BUN 12 01/13/2020   CREATININE 0.78 01/13/2020   GLUCOSE 80 01/13/2020   Lab Results  Component Value Date   INR 0.9 01/13/2020    Assessment/Plan:  Estimated body mass index is 26.23 kg/m as calculated from the following:   Height as of this encounter: 5' 7.5" (1.715 m).   Weight as of this encounter: 77.1 kg. Patient admitted for PLIF L5-S1. Patient has failed a reasonable attempt at conservative therapy.  I explained the condition and procedure to the patient and answered any questions.  Patient wishes to proceed with procedure as planned. Understands risks/ benefits and typical outcomes of procedure.   Tia Alert 01/17/2020 8:27 AM

## 2020-01-17 NOTE — Anesthesia Procedure Notes (Signed)
Procedure Name: Intubation Date/Time: 01/17/2020 8:09 AM Performed by: Claris Che, CRNA Pre-anesthesia Checklist: Patient identified, Emergency Drugs available, Suction available, Patient being monitored and Timeout performed Patient Re-evaluated:Patient Re-evaluated prior to induction Oxygen Delivery Method: Circle system utilized Preoxygenation: Pre-oxygenation with 100% oxygen Induction Type: IV induction and Cricoid Pressure applied Ventilation: Mask ventilation without difficulty Laryngoscope Size: Mac and 4 Grade View: Grade II Tube type: Oral Tube size: 7.5 mm Number of attempts: 1 Airway Equipment and Method: Stylet Placement Confirmation: ETT inserted through vocal cords under direct vision,  positive ETCO2 and breath sounds checked- equal and bilateral Secured at: 23 cm Tube secured with: Tape Dental Injury: Teeth and Oropharynx as per pre-operative assessment

## 2020-01-17 NOTE — Evaluation (Signed)
Physical Therapy Evaluation Patient Details Name: Tammy [name] MRN: [id number] DOB: [birth year] Today's Date: 01/17/2020   History of Present Illness  49 yo female s/p PLIF L5-S1 on 12/27. PMH includes OA, PLIF L2-5 2018.  Clinical Impression  Pt presents with decreased knowledge and application of spinal precautions post-operatively, decreased activity tolerance vs baseline, and mildly impaired gait suspect secondary to pain medications. Pt to benefit from acute PT to address deficits. Pt ambulated hallway distance with close guard for safety, no overt LOB but mildly unsteady especially with directional changes. PT reinforced spinal precautions throughout session, pt receptive and applies precautions well. PT to progress mobility as tolerated, and will continue to follow acutely.      Follow Up Recommendations No PT follow up    Equipment Recommendations  None recommended by PT    Recommendations for Other Services       Precautions / Restrictions Precautions Precautions: Fall;Back Precaution Booklet Issued: Yes (comment) Precaution Comments: reviewed BLT rules, log roll in and out of bed Required Braces or Orthoses: Spinal Brace Spinal Brace: Lumbar corset Restrictions Weight Bearing Restrictions: No      Mobility  Bed Mobility Overal bed mobility: Needs Assistance Bed Mobility: Rolling;Sidelying to Sit;Sit to Sidelying Rolling: Supervision Sidelying to sit: Supervision     Sit to sidelying: Supervision General bed mobility comments: for safety, verbal cuing for log roll technique in and out of bed.    Transfers Overall transfer level: Needs assistance Equipment used: None Transfers: Sit to/from Stand Sit to Stand: Supervision         General transfer comment: for safety, slow to rise and steady. STS x3, from EOB x2 and toilet x1.  Ambulation/Gait Ambulation/Gait assistance: Min guard Gait Distance (Feet): 200 Feet Assistive device: None Gait  Pattern/deviations: Drifts right/left;Step-through pattern;Decreased stride length Gait velocity: wfl   General Gait Details: min guard for safety, verbal cuing for upright posture. Pt with mild weaving of gait, pt reports feeling "loopy" from pain medications.  Stairs            Wheelchair Mobility    Modified Rankin (Stroke Patients Only)       Balance Overall balance assessment: Needs assistance Sitting-balance support: No upper extremity supported;Feet supported Sitting balance-Leahy Scale: Good     Standing balance support: During functional activity Standing balance-Leahy Scale: Good Standing balance comment: ambulatory without UE support                             Pertinent Vitals/Pain Pain Assessment: Faces Faces Pain Scale: Hurts a little bit Pain Location: low back Pain Descriptors / Indicators: Tightness Pain Intervention(s): Limited activity within patient's tolerance;Monitored during session;Repositioned;Premedicated before session    Home Living Family/patient expects to be discharged to:: Private residence Living Arrangements: Spouse/significant other;Children Available Help at Discharge: Family;Available 24 hours/day Type of Home: House Home Access: Level entry;Stairs to enter (level entry through back)   Entrance Stairs-Number of Steps: 2 (to get to bedroom) Home Layout: One level;Other (Comment) (2 steps into bedroom/bathroom) Home Equipment: Adaptive equipment      Prior Function Level of Independence: Independent         Comments: Independent, active. Enjoys horseback riding and water skiing     Hand Dominance   Dominant Hand: Right    Extremity/Trunk Assessment   Upper Extremity Assessment Upper Extremity Assessment: Defer to OT evaluation    Lower Extremity Assessment Lower Extremity Assessment: Overall WFL for tasks assessed  Cervical / Trunk Assessment Cervical / Trunk Assessment: Other  exceptions Cervical / Trunk Exceptions: s/p PLIF  Communication   Communication: No difficulties  Cognition Arousal/Alertness: Awake/alert Behavior During Therapy: WFL for tasks assessed/performed Overall Cognitive Status: Within Functional Limits for tasks assessed                                        General Comments General comments (skin integrity, edema, etc.): Husband present and supportive during session. Educated on use of [facility] as shower chair    Exercises     Assessment/Plan    PT Assessment Patient needs continued PT services  PT Problem List Decreased mobility;Decreased knowledge of precautions;Decreased activity tolerance;Decreased balance;Pain       PT Treatment Interventions DME instruction;Therapeutic activities;Gait training;Therapeutic exercise;Patient/family education;Balance training;Functional mobility training;Stair training    PT Goals (Current goals can be found in the Care Plan section)  Acute Rehab PT Goals Patient Stated Goal: go home ASAP PT Goal Formulation: With patient/family Time For Goal Achievement: 01/31/20 Potential to Achieve Goals: Good    Frequency Min 5X/week   Barriers to discharge        Co-evaluation               AM-PAC PT "6 Clicks" Mobility  Outcome Measure Help needed turning from your back to your side while in a flat bed without using bedrails?: None Help needed moving from lying on your back to sitting on the side of a flat bed without using bedrails?: None Help needed moving to and from a bed to a chair (including a wheelchair)?: None Help needed standing up from a chair using your arms (e.g., wheelchair or bedside chair)?: None Help needed to walk in hospital room?: A Little Help needed climbing 3-5 steps with a railing? : A Little 6 Click Score: 22    End of Session Equipment Utilized During Treatment: Back brace Activity Tolerance: Patient tolerated treatment well Patient left: in bed;with  call bell/phone within reach;with family/visitor present Nurse Communication: Mobility status PT Visit Diagnosis: Other abnormalities of gait and mobility (R26.89);Difficulty in walking, not elsewhere classified (R26.2)    Time: 7824-2353 PT Time Calculation (min) (ACUTE ONLY): 17 min   Charges:   PT Evaluation $PT Eval Low Complexity: 1 Low         Tammy [name] S, PT Acute Rehabilitation Services Pager [phone]  Office [phone]   Truddie Coco 01/17/2020, 4:35 PM

## 2020-01-17 NOTE — Transfer of Care (Signed)
Immediate Anesthesia Transfer of Care Note  Patient: Tammy [name]  Procedure(s) Performed: POSTERIOR LUMBAR INTERBODY FUSION - LUMBAR FIVE-SACRAL ONE with extension of hardware (N/A Back)  Patient Location: PACU  Anesthesia Type:General  Level of Consciousness: sedated, drowsy, patient cooperative and responds to stimulation  Airway & Oxygen Therapy: Patient Spontanous Breathing and Patient connected to nasal cannula oxygen  Post-op Assessment: Report given to RN, Post -op Vital signs reviewed and stable and Patient moving all extremities X 4  Post vital signs: Reviewed and stable  Last Vitals:  Vitals Value Taken Time  BP 146/99 01/17/20 1131  Temp    Pulse 73 01/17/20 1134  Resp 23 01/17/20 1134  SpO2 90 % 01/17/20 1134  Vitals shown include unvalidated device data.  Last Pain:  Vitals:   01/17/20 0608  TempSrc:   PainSc: 4          Complications: No complications documented.

## 2020-01-17 NOTE — Op Note (Signed)
01/17/2020  11:07 AM  PATIENT:  Tammy [name]  49 y.o. 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 Decompressive lumbar laminectomy hemifacetectomy and foraminotomies L5-S1 requiring more work than would be required for a simple exposure of the disk for PLIF in order to adequately decompress the neural elements and address the spinal stenosis 2. Posterior lumbar interbody fusion L5-S1 using peek interbody cages packed with morcellized allograft and autograft soaked with a bone marrow aspirate obtained through a separate fascial incision over the right iliac crest 3. Posterior fixation L5-S1 using Nuvasive cortical pedicle screws.  4. Intertransverse arthrodesis L5-S1 right using morcellized autograft and allograft. 5.  Removal of segmental instrumentation L2-L5 with exploration of fusion L2-L5  SURGEON:  Marikay Alar, MD  ASSISTANTS: Verlin Dike, FNP  ANESTHESIA:  General  EBL: 100 ml  Total I/O In: 1600 [I.V.:1000; IV Piggyback:600] Out: 250 [Urine:150; Blood:100]  BLOOD ADMINISTERED:none  DRAINS: none   INDICATION FOR PROCEDURE: This patient presented with back and bilateral hip pain. Imaging revealed a solid arthrodesis L2-L5 with progressive adjacent level spondylosis L5-S1 with progressive disc protrusion and degenerative disc disease with Modic endplate changes. The patient tried a reasonable attempt at conservative medical measures without relief. I recommended decompression and instrumented fusion to address the stenosis as well as the segmental  instability.  Patient understood the risks, benefits, and alternatives and potential outcomes and wished to proceed.  PROCEDURE DETAILS:  The patient was brought to the operating room. After induction of generalized  endotracheal anesthesia the patient was rolled into the prone position on chest rolls and all pressure points were padded. The patient's lumbar region was cleaned and then prepped with DuraPrep and draped in the usual sterile fashion. Anesthesia was injected and then a dorsal midline incision was made and carried down to the lumbosacral fascia. The fascia was opened and the paraspinous musculature was taken down in a subperiosteal fashion to expose L5-S1 as well as the previously placed instrumentation L2-L5. A self-retaining retractor was placed. Intraoperative fluoroscopy confirmed my level, and I started with the instrumentation removal.  The locking caps were removed bilaterally from L2-L5 and the rods were removed.  We explored the fusion as best we could and pulled on each individual screw successively with all screws moving and Unasyn suggesting fusion.  All screws had solid purchase.  We plan on leaving all screws in place in case she developed adjacent level disease above the construct.  I then dissected in a suprafascial plane to expose the iliac crest.  Opened the fascia and we used a Jamshidi needle to extract 60 cc of bone marrow aspirate from the iliac crest with the assistance of my nurse practitioner.  This was then spun down by [facility] device and 2 to 4 cc of  BMAC was soaked on morselized allograft for later arthrodesis.  I dried the hole with Surgifoam and closed the fascia.  I then turned my attention to the decompression and complete lumbar laminectomies, hemi- facetectomies, and foraminotomies were performed at L5-S1.  My nurse practitioner was directly involved in the decompression and exposure of the neural elements. the patient had significant spinal stenosis and this required more work than would be required for a simple exposure of the disc for posterior lumbar interbody fusion which would only require a limited laminotomy. Much more generous  decompression and generous foraminotomy was  undertaken in order to adequately decompress the neural elements and address the patient's leg pain. The yellow ligament was removed to expose the underlying dura and nerve roots, and generous foraminotomies were performed to adequately decompress the neural elements. Both the exiting and traversing nerve roots were decompressed on both sides until a coronary dilator passed easily along the nerve roots. Once the decompression was complete, I turned my attention to the posterior [name] lumbar interbody fusion. The epidural venous vasculature was coagulated and cut sharply. Disc space was incised and the initial discectomy was performed with pituitary rongeurs. The disc space was distracted with sequential distractors to a height of 8 mm. We then used a series of scrapers and shavers to prepare the endplates for fusion. The midline was prepared with Epstein curettes. Once the complete discectomy was finished, we packed an appropriate sized interbody peek cage with local autograft and morcellized allograft, gently retracted the nerve root, and tapped the cage into position at L5-S1 bilaterally.  The midline between the cages was packed with morselized autograft and allograft. We then turned our attention to the placement of the [name] S1 pedicle screws. The pedicle screw entry zones were identified utilizing surface landmarks and fluoroscopy. I drilled into each pedicle utilizing the hand drill, and tapped each pedicle with the appropriate 5.5 tap. We palpated with a ball probe to assure no break in the cortex. We then placed 6.5 x 35 mm pedicle screws into the pedicles bilaterally at S1.  My nurse practitioner assisted in placement of the pedicle screws.  We then decorticated the transverse processes and laid a mixture of morcellized autograft and allograft out over these to perform intertransverse arthrodesis at L5-S1 on the right. We then placed lordotic rods into the multiaxial screw heads of the pedicle screws from  L4-S1 bilaterally and locked these in position with the locking caps and anti-torque device. We then checked our construct with AP and lateral fluoroscopy. Irrigated with copious amounts of bacitracin-containing saline solution. Inspected the nerve roots once again to assure adequate decompression, lined to the dura with Gelfoam,  and then we closed the muscle and the fascia with 0 Vicryl. Closed the subcutaneous tissues with 2-0 Vicryl and subcuticular tissues with 3-0 Vicryl. The skin was closed with benzoin and Steri-Strips. Dressing was then applied, the patient was awakened from general anesthesia and transported to the recovery room in stable condition. At the end of the procedure all sponge, needle and instrument counts were correct.   PLAN OF CARE: admit to observation  PATIENT DISPOSITION:  PACU - hemodynamically stable.   Delay start of Pharmacological VTE agent (>24hrs) due to surgical blood loss or risk of bleeding:  yes

## 2020-01-17 NOTE — Evaluation (Signed)
Occupational Therapy Evaluation/Discharge Patient Details Name: Tammy [name] MRN: [id number] DOB: September 13, 1970 Today's Date: 01/17/2020    History of Present Illness Patient is a 49 y.o. female admitted for back pain. Onset of symptoms was several months ago, gradually worsening since that time.  MRI shows adjacent level disease L5-S1 with lumbar disc herniation, degenerative disc disease and foraminal stenosis causing back and bilateral hip pain, previous L2-L5 fusion. Pt underwent lumbar laminectomy and fusion L5-S1.   Clinical Impression   PTA, pt lives with spouse and reports Independence in all daily tasks without use of AD. Pt premedicated prior to session with no reports of pain. Pt able to demo log rolling after initial cues and able to recall 3/3 back precautions. Pt overall Modified Independent with ADLs, mobility with IV pole for support in hallway. Anticipate pt to progress well towards independence and reports good family support at home. Recommend BSC for use as shower chair to maximize safety with LB ADLs. No further skilled OT services needed at acute level or on discharge. OT to sign off.     Follow Up Recommendations  No OT follow up;Supervision - Intermittent    Equipment Recommendations  3 in 1 bedside commode    Recommendations for Other Services       Precautions / Restrictions Precautions Precautions: Fall;Back Precaution Booklet Issued: Yes (comment) Required Braces or Orthoses: Spinal Brace Spinal Brace: Lumbar corset Restrictions Weight Bearing Restrictions: No      Mobility Bed Mobility Overal bed mobility: Modified Independent             General bed mobility comments: Able to demo log rolling after cues    Transfers Overall transfer level: Modified independent Equipment used:  (IV pole)             General transfer comment: Modified Independent without AD. Use of IV pole for support in mobility but likely would do fine without  use of IV pole once "loopiness" from medication wears off    Balance Overall balance assessment: Needs assistance Sitting-balance support: No upper extremity supported;Feet supported Sitting balance-Leahy Scale: Good     Standing balance support: Single extremity supported;During functional activity Standing balance-Leahy Scale: Good Standing balance comment: use of IV pole during mobility but able to demo dynamic tasks in room without                           ADL either performed or assessed with clinical judgement   ADL Overall ADL's : Modified independent                                       General ADL Comments: Able to demo donning LSO brace, gathering items in room, managing gown in standing without AD     Vision Baseline Vision/History: No visual deficits Patient Visual Report: No change from baseline Vision Assessment?: No apparent visual deficits     Perception     Praxis      Pertinent Vitals/Pain Pain Assessment: No/denies pain (premedicated)     Hand Dominance Right   Extremity/Trunk Assessment Upper Extremity Assessment Upper Extremity Assessment: Overall WFL for tasks assessed   Lower Extremity Assessment Lower Extremity Assessment: Defer to PT evaluation   Cervical / Trunk Assessment Cervical / Trunk Assessment: Normal   Communication Communication Communication: No difficulties   Cognition Arousal/Alertness: Awake/alert Behavior During Therapy: [facility]  for tasks assessed/performed Overall Cognitive Status: Within Functional Limits for tasks assessed                                     General Comments  Husband present and supportive during session. Educated on use of [facility] as shower chair    Exercises     Shoulder Instructions      Home Living Family/patient expects to be discharged to:: Private residence Living Arrangements: Spouse/significant other;Children Available Help at Discharge:  Family;Available 24 hours/day Type of Home: House Home Access: Level entry;Stairs to enter (level entry through back)     Home Layout: One level;Other (Comment) (2 steps into bedroom/bathroom)     Bathroom Shower/Tub: Producer, television/film/video: Standard     Home Equipment: Mudlogger: Reacher        Prior Functioning/Environment Level of Independence: Independent        Comments: Independent, active. Enjoys horseback riding and water skiing        OT Problem List:        OT Treatment/Interventions:      OT Goals(Current goals can be found in the care plan section) Acute Rehab OT Goals Patient Stated Goal: be able to go wakeboarding in the summer OT Goal Formulation: All assessment and education complete, DC therapy  OT Frequency:     Barriers to D/C:            Co-evaluation              AM-PAC OT "6 Clicks" Daily Activity     Outcome Measure Help from another person eating meals?: None Help from another person taking care of personal grooming?: None Help from another person toileting, which includes using toliet, bedpan, or urinal?: None Help from another person bathing (including washing, rinsing, drying)?: None Help from another person to put on and taking off regular upper body clothing?: None Help from another person to put on and taking off regular lower body clothing?: None 6 Click Score: 24   End of Session Equipment Utilized During Treatment: Gait belt;Back brace Nurse Communication: Mobility status;Other (comment) (DME needs)  Activity Tolerance: Patient tolerated treatment well Patient left: in bed;with call bell/phone within reach;with family/visitor present  OT Visit Diagnosis: Muscle weakness (generalized) (M62.81);Other abnormalities of gait and mobility (R26.89)                Time: 1439-1500 OT Time Calculation (min): 21 min Charges:  OT General Charges $OT Visit: 1 Visit OT Evaluation $OT Eval  Low Complexity: 1 Low  Lorre Munroe, OTR/L  Lorre Munroe 01/17/2020, 3:12 PM

## 2020-01-18 DIAGNOSIS — M5126 Other intervertebral disc displacement, lumbar region: Secondary | ICD-10-CM | POA: Diagnosis not present

## 2020-01-18 MED ORDER — OXYCODONE-ACETAMINOPHEN 5-325 MG PO TABS: 1.0000 | ORAL_TABLET | ORAL | 0 refills | Status: AC | PRN

## 2020-01-18 MED ORDER — METHOCARBAMOL 500 MG PO TABS: 500.0000 mg | ORAL_TABLET | Freq: Four times a day (QID) | ORAL | 0 refills | Status: DC

## 2020-01-18 NOTE — Discharge Summary (Signed)
Physician Discharge Summary  Patient ID: Tammy [name] MRN: [id number] DOB/AGE: 51-Dec-1972 51 y.o.  Admit date: 01/17/2020 Discharge date: 01/18/2020  Admission Diagnoses: Adjacent level disease L5-S1 with lumbar disc herniation, degenerative disc disease and foraminal stenosis causing back and bilateral hip pain, previous L2-L5 fusion   Discharge Diagnoses: same   Discharged Condition: good  Hospital Course: The patient was admitted on 01/17/2020 and taken to the operating room where the patient underwent PLIF L5-S1. The patient tolerated the procedure well and was taken to the recovery room and then to the floor in stable condition. The hospital course was routine. There were no complications. The wound remained clean dry and intact. Pt had appropriate back soreness. No complaints of leg pain or new N/T/W. The patient remained afebrile with stable vital signs, and tolerated a regular diet. The patient continued to increase activities, and pain was well controlled with oral pain medications.   Consults: None  Significant Diagnostic Studies:  Results for orders placed or performed during the hospital encounter of 01/17/20  SARS Coronavirus 2 by RT PCR (hospital order, performed in [facility] hospital lab) Nasopharyngeal Nasopharyngeal Swab   Specimen: Nasopharyngeal Swab  Result Value Ref Range   SARS Coronavirus 2 NEGATIVE NEGATIVE    Chest 2 View  Result Date: 01/13/2020 CLINICAL DATA:  Preoperative lumbar surgery EXAM: CHEST - 2 VIEW COMPARISON:  June 27, 2016 FINDINGS: Lungs are clear. Heart size and pulmonary vascularity are normal. No adenopathy. There is postoperative change in the lumbar spine region. There is thoracolumbar levoscoliosis. IMPRESSION: Lungs clear.  Cardiac silhouette normal. Electronically Signed   By: Bretta Bang III M.D.   On: 01/13/2020 14:41   DG Lumbar Spine 2-3 Views  Result Date: 01/17/2020 CLINICAL DATA:  PLIF EXAM: LUMBAR SPINE - 2-3  VIEW; DG C-ARM 1-60 MIN COMPARISON:  None. FINDINGS: Multiple intraoperative fluoroscopic spot images are provided. Interval posterior lumbar interbody fusion at L5-S1. FLUOROSCOPY TIME:  35.4 mGy 40 seconds IMPRESSION: Interval posterior lumbar interbody fusion at L5-S1. Electronically Signed   By: Elige Ko   On: 01/17/2020 11:22   DG C-Arm 1-60 Min  Result Date: 01/17/2020 CLINICAL DATA:  PLIF EXAM: LUMBAR SPINE - 2-3 VIEW; DG C-ARM 1-60 MIN COMPARISON:  None. FINDINGS: Multiple intraoperative fluoroscopic spot images are provided. Interval posterior lumbar interbody fusion at L5-S1. FLUOROSCOPY TIME:  35.4 mGy 40 seconds IMPRESSION: Interval posterior lumbar interbody fusion at L5-S1. Electronically Signed   By: Elige Ko   On: 01/17/2020 11:22    Antibiotics:  Anti-infectives (From admission, onward)   Start     Dose/Rate Route Frequency Ordered Stop   01/17/20 1700  ceFAZolin (ANCEF) IVPB 2g/100 mL premix        2 g 200 mL/hr over 30 Minutes Intravenous Every 8 hours 01/17/20 1258 01/18/20 0049   01/17/20 0600  ceFAZolin (ANCEF) IVPB 2g/100 mL premix        2 g 200 mL/hr over 30 Minutes Intravenous On call to O.R. 01/17/20 0550 01/17/20 0917      Discharge Exam: Blood pressure 116/68, pulse (!) 57, temperature 97.7 F (36.5 C), temperature source Oral, resp. rate 18, height 5' 7.5" (1.715 m), weight 77.1 kg, SpO2 94 %. Neurologic: Grossly normal Ambulating and voiding well, incision cdi  Discharge Medications:   Allergies as of 01/18/2020      Reactions   No Known Allergies       Medication List    STOP taking these medications   HYDROcodone-acetaminophen 5-325  MG tablet Commonly known as: NORCO/VICODIN   traMADol 50 MG tablet Commonly known as: ULTRAM     TAKE these medications   itraconazole 100 MG capsule Commonly known as: Sporanox Take 2 capsules (200 mg total) by mouth 2 (two) times daily. For one week. Wait 3 weeks prior to refilling.   methocarbamol  500 MG tablet Commonly known as: Robaxin Take 1 tablet (500 mg total) by mouth 4 (four) times daily. What changed:   medication strength  how much to take  when to take this  reasons to take this   oxyCODONE-acetaminophen 5-325 MG tablet Commonly known as: Percocet Take 1 tablet by mouth every 4 (four) hours as needed for severe pain.            Durable Medical Equipment  (From admission, onward)         Start     Ordered   01/17/20 1259  DME Walker rolling  Once       Question:  Patient needs a walker to treat with the following condition  Answer:  S/P lumbar fusion   01/17/20 1258   01/17/20 1259  DME 3 n 1  Once        01/17/20 1258          Disposition: home   Final Dx: PLIF L5-S1  Discharge Instructions     Remove dressing in 72 hours   Complete by: As directed    Call MD for:  difficulty breathing, headache or visual disturbances   Complete by: As directed    Call MD for:  hives   Complete by: As directed    Call MD for:  persistant nausea and vomiting   Complete by: As directed    Call MD for:  redness, tenderness, or signs of infection (pain, swelling, redness, odor or green/yellow discharge around incision site)   Complete by: As directed    Call MD for:  severe uncontrolled pain   Complete by: As directed    Call MD for:  temperature >100.4   Complete by: As directed    Diet - low sodium heart healthy   Complete by: As directed    Driving Restrictions   Complete by: As directed    No driving for 2 weeks, no riding in the car for 1 week   Increase activity slowly   Complete by: As directed    Lifting restrictions   Complete by: As directed    No lifting more than 8 lbs         Signed: Tiana Loft Pawan Knechtel 01/18/2020, 6:47 AM

## 2020-01-18 NOTE — Plan of Care (Signed)
Patient alert and oriented, mae's well, voiding adequate amount of urine, swallowing without difficulty, no c/o pain at time of discharge. Patient discharged home with family. Script and discharged instructions given to patient. Patient and family stated understanding of instructions given. Patient has an appointment with Dr. jones   

## 2020-01-18 NOTE — Progress Notes (Signed)
Physical Therapy Treatment and Discharge Patient Details Name: Tammy [name] MRN: [id number] DOB: May 27, 1970 Today's Date: 01/18/2020    History of Present Illness 49 yo female s/p PLIF L5-S1 on 12/27. PMH includes OA, PLIF L2-5 2018.    PT Comments    Pt progressing well with post-op mobility. She was able to demonstrate transfers and ambulation with gross modified independence and no AD. Pt was educated on precautions, brace application/wearing schedule, appropriate activity progression, and car transfer. Pt has met acute PT goals at this time and will be d/c from acute PT services. If needs change, please reconsult.     Follow Up Recommendations  No PT follow up     Equipment Recommendations  None recommended by PT    Recommendations for Other Services       Precautions / Restrictions Precautions Precautions: Fall;Back Precaution Booklet Issued: Yes (comment) Precaution Comments: Reviewed precautions during functional mobility. Required Braces or Orthoses: Spinal Brace Spinal Brace: Lumbar corset Restrictions Weight Bearing Restrictions: No    Mobility  Bed Mobility               General bed mobility comments: Verbally reviewed log roll technique - Pt was received standing in room.  Transfers Overall transfer level: Modified independent Equipment used: None Transfers: Sit to/from Stand           General transfer comment: No assist required.  Ambulation/Gait Ambulation/Gait assistance: Modified independent (Device/Increase time) Gait Distance (Feet): 400 Feet Assistive device: None Gait Pattern/deviations: Drifts right/left;Step-through pattern;Decreased stride length Gait velocity: Mildly decreased Gait velocity interpretation: 1.31 - 2.62 ft/sec, indicative of limited community ambulator General Gait Details: Continues with mild drift in hallway however no unsteadiness or LOB noted. Overall ambulating without difficulty.   Stairs Stairs:  Yes Stairs assistance: Modified independent (Device/Increase time) Stair Management: One rail Right;Alternating pattern;Forwards;No rails Number of Stairs: 10 General stair comments: VC's for general safety. No assist required.   Wheelchair Mobility    Modified Rankin (Stroke Patients Only)       Balance Overall balance assessment: Needs assistance Sitting-balance support: No upper extremity supported;Feet supported Sitting balance-Leahy Scale: Good     Standing balance support: During functional activity Standing balance-Leahy Scale: Good                              Cognition Arousal/Alertness: Awake/alert Behavior During Therapy: WFL for tasks assessed/performed Overall Cognitive Status: Within Functional Limits for tasks assessed                                        Exercises      General Comments        Pertinent Vitals/Pain Pain Assessment: Faces Faces Pain Scale: Hurts a little bit Pain Location: low back Pain Descriptors / Indicators: Tightness Pain Intervention(s): Limited activity within patient's tolerance;Monitored during session;Repositioned    Home Living                      Prior Function            PT Goals (current goals can now be found in the care plan section) Acute Rehab PT Goals Patient Stated Goal: go home ASAP PT Goal Formulation: With patient/family Time For Goal Achievement: 01/31/20 Potential to Achieve Goals: Good Progress towards PT goals: Progressing toward goals    Frequency  Min 5X/week      PT Plan Current plan remains appropriate    Co-evaluation              AM-PAC PT "6 Clicks" Mobility   Outcome Measure  Help needed turning from your back to your side while in a flat bed without using bedrails?: None Help needed moving from lying on your back to sitting on the side of a flat bed without using bedrails?: None Help needed moving to and from a bed to a chair  (including a wheelchair)?: None Help needed standing up from a chair using your arms (e.g., wheelchair or bedside chair)?: None Help needed to walk in hospital room?: A Little Help needed climbing 3-5 steps with a railing? : A Little 6 Click Score: 22    End of Session Equipment Utilized During Treatment: Back brace Activity Tolerance: Patient tolerated treatment well Patient left: in bed;with call bell/phone within reach Nurse Communication: Mobility status PT Visit Diagnosis: Other abnormalities of gait and mobility (R26.89);Difficulty in walking, not elsewhere classified (R26.2)     Time: 3007-6226 PT Time Calculation (min) (ACUTE ONLY): 11 min  Charges:  $Gait Training: 8-22 mins                     [name], PT, DPT Acute Rehabilitation Services Pager: [phone] Office: [phone]    [name] 01/18/2020, 9:59 AM

## 2021-01-17 ENCOUNTER — Telehealth (INDEPENDENT_AMBULATORY_CARE_PROVIDER_SITE_OTHER): Payer: BC Managed Care – PPO | Admitting: Family Medicine

## 2021-01-17 DIAGNOSIS — Z20822 Contact with and (suspected) exposure to covid-19: Secondary | ICD-10-CM

## 2021-01-17 DIAGNOSIS — R519 Headache, unspecified: Secondary | ICD-10-CM | POA: Diagnosis not present

## 2021-01-17 LAB — POC COVID19 BINAXNOW: SARS Coronavirus 2 Ag: NEGATIVE

## 2021-01-17 NOTE — Progress Notes (Signed)
Virtual Visit via Telephone Note   This visit type was conducted due to national recommendations for restrictions regarding the COVID-19 Pandemic (e.g. social distancing) in an effort to limit this patient's exposure and mitigate transmission in our community.  Due to her co-morbid illnesses, this patient is at least at moderate risk for complications without adequate follow up.  This format is felt to be most appropriate for this patient at this time.  The patient did not have access to video technology/had technical difficulties with video requiring transitioning to audio format only (telephone).  All issues noted in this document were discussed and addressed.  No physical exam could be performed with this format.  Patient verbally consented to a telehealth visit.   Date:  01/21/2021   ID:  Tammy [name], DOB Oct 21, 1970, MRN [id number]  Patient Location: Home - in car Provider Location: Office/Clinic  PCP:  Marianne Sofia, PA-C   Evaluation Performed:  acute  Chief Complaint:  headache  History of Present Illness:    Tammy [name] is a 50 y.o. female with a headache x 2 days. Daughter tested positive for Covid. No fever, chils, sore throat, earaches.   The patient does have symptoms concerning for COVID-19 infection (fever, chills, cough, or new shortness of breath).    Past Medical History:  Diagnosis Date   Arthritis    Back pain    Nail fungus     Past Surgical History:  Procedure Laterality Date   ABDOMINAL HYSTERECTOMY     BACK SURGERY     BUNIONECTOMY     RIGHT FOOT   MAXIMUM ACCESS (MAS)POSTERIOR LUMBAR INTERBODY FUSION (PLIF) 3 LEVEL N/A 07/10/2016   Procedure: Lumbar two-Lumbar three - Lumbar three-Lumbar four - Lumbar four-Lumbar five MAXIMUM ACCESS (MAS) POSTERIOR LUMBAR INTERBODY FUSION;  Surgeon: Tia Alert, MD;  Location: [facility] OR;  Service: Neurosurgery;  Laterality: N/A;   TONSILLECTOMY     TUBAL LIGATION      History reviewed. No pertinent  family history.  Social History   Socioeconomic History   Marital status: Married    Spouse name: Not on file   Number of children: Not on file   Years of education: Not on file   Highest education level: Not on file  Occupational History   Not on file  Tobacco Use   Smoking status: Former    Packs/day: 1.00    Years: 25.00    Pack years: 25.00    Types: Cigarettes    Quit date: 2018    Years since quitting: 5.0   Smokeless tobacco: Never  Vaping Use   Vaping Use: Every day  Substance and Sexual Activity   Alcohol use: Yes    Alcohol/week: 10.0 standard drinks    Types: 10 Cans of beer per week    Comment: SOCIAL   Drug use: No   Sexual activity: Yes    Birth control/protection: Surgical  Other Topics Concern   Not on file  Social History Narrative   ** Merged History Encounter **       Social Determinants of Health   Financial Resource Strain: Not on file  Food Insecurity: Not on file  Transportation Needs: Not on file  Physical Activity: Not on file  Stress: Not on file  Social Connections: Not on file  Intimate Partner Violence: Not on file    Outpatient Medications Prior to Visit  Medication Sig Dispense Refill   itraconazole (SPORANOX) 100 MG capsule Take 2 capsules (200 mg  total) by mouth 2 (two) times daily. For one week. Wait 3 weeks prior to refilling. 28 capsule 1   methocarbamol (ROBAXIN) 500 MG tablet Take 1 tablet (500 mg total) by mouth 4 (four) times daily. 45 tablet 0   oxyCODONE-acetaminophen (PERCOCET) 5-325 MG tablet Take 1 tablet by mouth every 4 (four) hours as needed for severe pain. 20 tablet 0   No facility-administered medications prior to visit.    Allergies:   No known allergies   Social History   Tobacco Use   Smoking status: Former    Packs/day: 1.00    Years: 25.00    Pack years: 25.00    Types: Cigarettes    Quit date: 2018    Years since quitting: 5.0   Smokeless tobacco: Never  Vaping Use   Vaping Use: Every day   Substance Use Topics   Alcohol use: Yes    Alcohol/week: 10.0 standard drinks    Types: 10 Cans of beer per week    Comment: SOCIAL   Drug use: No     Review of Systems  Constitutional:  Negative for chills, fever and malaise/fatigue.  HENT:  Negative for ear pain, sinus pain and sore throat.   Respiratory:  Negative for cough and shortness of breath.   Cardiovascular:  Negative for chest pain.  Musculoskeletal:  Negative for myalgias.  Neurological:  Positive for headaches.    Labs/Other Tests and Data Reviewed:    Recent Labs: No results found for requested labs within last 8760 hours.   Recent Lipid Panel No results found for: CHOL, TRIG, HDL, CHOLHDL, LDLCALC, LDLDIRECT  Wt Readings from Last 3 Encounters:  01/17/20 170 lb (77.1 kg)  01/13/20 171 lb 9.6 oz (77.8 kg)  12/01/17 168 lb (76.2 kg)     Objective:    Vital Signs:  There were no vitals taken for this visit.   Physical Exam  No vitals.  Does not sound congested.  ASSESSMENT & PLAN:   1. Close exposure to COVID-19 virus - POC COVID-19 BinaxNow  2. Acute nonintractable headache, unspecified headache type    Orders Placed This Encounter  Procedures   POC COVID-19 BinaxNow     No orders of the defined types were placed in this encounter.   COVID-19 Education: The signs and symptoms of COVID-19 were discussed with the patient and how to seek care for testing (follow up with PCP or arrange E-visit). The importance of social distancing was discussed today.   I spent 6 minutes dedicated to the care of this patient on the date of this encounter to include face-to-face time with the patient.  Follow Up:  Virtual Visit  prn  Signed,  Blane Ohara, MD  01/21/2021 5:43 PM    [facility]

## 2021-01-21 ENCOUNTER — Encounter: Payer: Self-pay | Admitting: Family Medicine

## 2021-01-21 DIAGNOSIS — Z20822 Contact with and (suspected) exposure to covid-19: Secondary | ICD-10-CM | POA: Insufficient documentation

## 2021-01-21 DIAGNOSIS — R519 Headache, unspecified: Secondary | ICD-10-CM

## 2021-01-21 HISTORY — DX: Headache, unspecified: R51.9

## 2021-01-21 HISTORY — DX: Contact with and (suspected) exposure to covid-19: Z20.822

## 2021-01-21 NOTE — Assessment & Plan Note (Signed)
Concerned may be secondary to covid 19, but was negative.

## 2021-01-21 NOTE — Assessment & Plan Note (Signed)
covid 19 negative.  

## 2021-01-25 DIAGNOSIS — J0100 Acute maxillary sinusitis, unspecified: Secondary | ICD-10-CM | POA: Diagnosis not present

## 2021-01-25 DIAGNOSIS — R0981 Nasal congestion: Secondary | ICD-10-CM | POA: Diagnosis not present

## 2021-01-25 DIAGNOSIS — Z20828 Contact with and (suspected) exposure to other viral communicable diseases: Secondary | ICD-10-CM | POA: Diagnosis not present

## 2021-01-25 DIAGNOSIS — R519 Headache, unspecified: Secondary | ICD-10-CM | POA: Diagnosis not present

## 2021-01-25 DIAGNOSIS — J029 Acute pharyngitis, unspecified: Secondary | ICD-10-CM | POA: Diagnosis not present

## 2021-03-27 ENCOUNTER — Encounter: Payer: Self-pay | Admitting: Physician Assistant

## 2021-03-27 ENCOUNTER — Ambulatory Visit (INDEPENDENT_AMBULATORY_CARE_PROVIDER_SITE_OTHER): Payer: BC Managed Care – PPO | Admitting: Physician Assistant

## 2021-03-27 ENCOUNTER — Other Ambulatory Visit: Payer: Self-pay

## 2021-03-27 VITALS — BP 148/92 | HR 68 | Temp 98.7°F | Ht 68.0 in | Wt 153.0 lb

## 2021-03-27 DIAGNOSIS — R5381 Other malaise: Secondary | ICD-10-CM | POA: Diagnosis not present

## 2021-03-27 DIAGNOSIS — R002 Palpitations: Secondary | ICD-10-CM | POA: Diagnosis not present

## 2021-03-27 DIAGNOSIS — I10 Essential (primary) hypertension: Secondary | ICD-10-CM

## 2021-03-27 DIAGNOSIS — R000 Tachycardia, unspecified: Secondary | ICD-10-CM | POA: Diagnosis not present

## 2021-03-27 DIAGNOSIS — F419 Anxiety disorder, unspecified: Secondary | ICD-10-CM

## 2021-03-27 MED ORDER — LORAZEPAM 0.5 MG PO TABS: 0.5000 mg | ORAL_TABLET | Freq: Two times a day (BID) | ORAL | 1 refills | Status: DC | PRN

## 2021-03-27 MED ORDER — LOSARTAN POTASSIUM 25 MG PO TABS: 25.0000 mg | ORAL_TABLET | Freq: Every day | ORAL | 2 refills | Status: DC

## 2021-03-27 NOTE — Progress Notes (Signed)
? ?Acute Office Visit ? ?Subjective:  ? ? Patient ID: Tammy [name], female    DOB: April 18, 1970, 51 y.o.   MRN: [id number] ? ?Chief Complaint  ?Patient presents with  ? Heart racing  ?  X1 month  ? ? ?HPI: ?Patient is in today for complaints that she feels episodes of her heart beating hard and fast.  This has been going on for the past few months.  Symptoms can occur at rest and with activity.   ?She denies chest pain or dyspnea.  Is concerned also because of strong family history of heart disease. ? ?Pt states she has also been having significant anxiety - has some family issues and change in job which has added to her anxiety.  She had taken a family members xanax which did seem to help her symptoms of feeling anxious and overwhelmed ? ?Pt is noted to have elevated bp today.  She has not been in our office in a few years.  She does state that it had been elevated while she was having back issues and chart does show history of elevated readings ? ?Past Medical History:  ?Diagnosis Date  ? Arthritis   ? Back pain   ? Nail fungus   ? ? ?Past Surgical History:  ?Procedure Laterality Date  ? ABDOMINAL HYSTERECTOMY    ? BACK SURGERY    ? BUNIONECTOMY    ? RIGHT FOOT  ? MAXIMUM ACCESS (MAS)POSTERIOR LUMBAR INTERBODY FUSION (PLIF) 3 LEVEL N/A 07/10/2016  ? Procedure: Lumbar two-Lumbar three - Lumbar three-Lumbar four - Lumbar four-Lumbar five MAXIMUM ACCESS (MAS) POSTERIOR LUMBAR INTERBODY FUSION;  Surgeon: Tia Alert, MD;  Location: [facility] OR;  Service: Neurosurgery;  Laterality: N/A;  ? TONSILLECTOMY    ? TUBAL LIGATION    ? ? ?History reviewed. No pertinent family history. ? ?Social History  ? ?Socioeconomic History  ? Marital status: Married  ?  Spouse name: Not on file  ? Number of children: Not on file  ? Years of education: Not on file  ? Highest education level: Not on file  ?Occupational History  ? Not on file  ?Tobacco Use  ? Smoking status: Former  ?  Packs/day: 1.00  ?  Years: 25.00  ?  Pack years:  25.00  ?  Types: Cigarettes  ?  Quit date: 2018  ?  Years since quitting: 5.1  ? Smokeless tobacco: Never  ?Vaping Use  ? Vaping Use: Every day  ?Substance and Sexual Activity  ? Alcohol use: Yes  ?  Alcohol/week: 10.0 standard drinks  ?  Types: 10 Cans of beer per week  ?  Comment: SOCIAL  ? Drug use: No  ? Sexual activity: Yes  ?  Birth control/protection: Surgical  ?Other Topics Concern  ? Not on file  ?Social History Narrative  ? ** Merged History Encounter **  ?    ? ?Social Determinants of Health  ? ?Financial Resource Strain: Not on file  ?Food Insecurity: Not on file  ?Transportation Needs: Not on file  ?Physical Activity: Not on file  ?Stress: Not on file  ?Social Connections: Not on file  ?Intimate Partner Violence: Not on file  ? ? ?Outpatient Medications Prior to Visit  ?Medication Sig Dispense Refill  ? itraconazole (SPORANOX) 100 MG capsule Take 2 capsules (200 mg total) by mouth 2 (two) times daily. For one week. Wait 3 weeks prior to refilling. 28 capsule 1  ? methocarbamol (ROBAXIN) 500 MG tablet Take 1 tablet (500  mg total) by mouth 4 (four) times daily. 45 tablet 0  ? ?No facility-administered medications prior to visit.  ? ? ?Allergies  ?Allergen Reactions  ? No Known Allergies   ? ?CONSTITUTIONAL: Negative for chills, fatigue, fever, unintentional weight gain and unintentional weight loss.  ?E/N/T: Negative for ear pain, nasal congestion and sore throat.  ?CARDIOVASCULAR: see HPI ?RESPIRATORY: Negative for recent cough and dyspnea.  ?GASTROINTESTINAL: Negative for abdominal pain, acid reflux symptoms, constipation, diarrhea, nausea and vomiting.  ?INTEGUMENTARY: Negative for rash.  ?PSYCHIATRIC: Negative for sleep disturbance and to question depression screen.  Negative for depression, negative for anhedonia.  ?   ? ?Objective:  ?  ?Physical Exam ?PHYSICAL EXAM:  ? ?VS: BP (!) 148/92   Pulse 68   Temp 98.7 ?F (37.1 ?C) (Oral)   Ht 5\' 8"  (1.727 m)   Wt 153 lb (69.4 kg)   SpO2 98%   BMI  23.26 kg/m?  ? ?GEN: Well nourished, well developed, in no acute distress  ?HEENT: normal external ears and nose - normal external auditory canals and TMS - hearing grossly normal - normal nasal mucosa and septum - Lips, Teeth and Gums - normal  ?Oropharynx - normal mucosa, palate, and posterior pharynx ?Cardiac: RRR; no murmurs, rubs, or gallops,no edema -  ?Respiratory:  normal respiratory rate and pattern with no distress - normal breath sounds with no rales, rhonchi, wheezes or rubs ?GI: normal bowel sounds, no masses or tenderness ?Skin: warm and dry, no rash  ?Psych: euthymic mood, appropriate affect and demeanor ? ?BP (!) 148/92   Pulse 68   Temp 98.7 ?F (37.1 ?C) (Oral)   Ht 5\' 8"  (1.727 m)   Wt 153 lb (69.4 kg)   SpO2 98%   BMI 23.26 kg/m?  ?Wt Readings from Last 3 Encounters:  ?03/27/21 153 lb (69.4 kg)  ?01/17/20 170 lb (77.1 kg)  ?01/13/20 171 lb 9.6 oz (77.8 kg)  ? ? ?Health Maintenance Due  ?Topic Date Due  ? COVID-19 Vaccine (1) Never done  ? HIV Screening  Never done  ? Hepatitis C Screening  Never done  ? TETANUS/TDAP  Never done  ? PAP SMEAR-Modifier  Never done  ? COLONOSCOPY (Pts 45-51yrs Insurance coverage will need to be confirmed)  Never done  ? MAMMOGRAM  Never done  ? Zoster Vaccines- Shingrix (1 of 2) Never done  ? INFLUENZA VACCINE  Never done  ? ? ?There are no preventive care reminders to display for this patient. ? ? ?No results found for: TSH ?Lab Results  ?Component Value Date  ? WBC 5.4 01/13/2020  ? HGB 13.0 01/13/2020  ? HCT 41.2 01/13/2020  ? MCV 97.6 01/13/2020  ? PLT 245 01/13/2020  ? ?Lab Results  ?Component Value Date  ? NA 139 01/13/2020  ? K 4.1 01/13/2020  ? CO2 28 01/13/2020  ? GLUCOSE 80 01/13/2020  ? BUN 12 01/13/2020  ? CREATININE 0.78 01/13/2020  ? BILITOT 0.3 01/05/2018  ? ALKPHOS 52 01/05/2018  ? AST 11 01/05/2018  ? ALT 10 01/05/2018  ? PROT 6.2 01/05/2018  ? ALBUMIN 4.2 01/05/2018  ? CALCIUM 9.2 01/13/2020  ? ANIONGAP 10 01/13/2020  ? ?No results found for:  CHOL ?No results found for: HDL ?No results found for: LDLCALC ?No results found for: TRIG ?No results found for: CHOLHDL ?No results found for: HGBA1C ? ?   ?Assessment & Plan:  ? ?Problem List Items Addressed This Visit   ?None ?Visit Diagnoses   ? ?  Palpitations    -  Primary  ? Relevant Orders  ? EKG 12-Lead  ? Ambulatory referral to Cardiology  ? Benign hypertension      ? Relevant Medications  ? losartan (COZAAR) 25 MG tablet  ? Other Relevant Orders  ? CBC with Differential/Platelet  ? Comprehensive metabolic panel  ? Lipid panel  ? Thyroid Panel With TSH  ? Malaise      ? Relevant Orders  ? CBC with Differential/Platelet  ? Comprehensive metabolic panel  ? Thyroid Panel With TSH  ? Tachycardia      ? Relevant Orders  ? Ambulatory referral to Cardiology  ? Anxiety      ? Relevant Medications  ? LORazepam (ATIVAN) 0.5 MG tablet  ? ?  ? ?Meds ordered this encounter  ?Medications  ? losartan (COZAAR) 25 MG tablet  ?  Sig: Take 1 tablet (25 mg total) by mouth daily.  ?  Dispense:  30 tablet  ?  Refill:  2  ?  Order Specific Question:   Supervising Provider  ?  AnswerBlane Ohara [[id number]]  ? LORazepam (ATIVAN) 0.5 MG tablet  ?  Sig: Take 1 tablet (0.5 mg total) by mouth 2 (two) times daily as needed for anxiety.  ?  Dispense:  30 tablet  ?  Refill:  1  ?  Order Specific Question:   Supervising Provider  ?  AnswerBlane Ohara [[id number]]  ? ? ?Orders Placed This Encounter  ?Procedures  ? CBC with Differential/Platelet  ? Comprehensive metabolic panel  ? Lipid panel  ? Thyroid Panel With TSH  ? Ambulatory referral to Cardiology  ? EKG 12-Lead  ?  ? ?Follow-up: Return for as scheduled for cpx. ? ?An After Visit Summary was printed and given to the patient. ? ?SARA R Keymarion Bearman, PA-C ?[facility] ?[phone] ?

## 2021-03-28 LAB — COMPREHENSIVE METABOLIC PANEL
ALT: 14 IU/L (ref 0–32)
AST: 16 IU/L (ref 0–40)
Albumin/Globulin Ratio: 2 (ref 1.2–2.2)
Albumin: 4.7 g/dL (ref 3.8–4.9)
Alkaline Phosphatase: 59 IU/L (ref 44–121)
BUN/Creatinine Ratio: 24 — ABNORMAL HIGH (ref 9–23)
BUN: 19 mg/dL (ref 6–24)
Bilirubin Total: 0.4 mg/dL (ref 0.0–1.2)
CO2: 28 mmol/L (ref 20–29)
Calcium: 10 mg/dL (ref 8.7–10.2)
Chloride: 99 mmol/L (ref 96–106)
Creatinine, Ser: 0.78 mg/dL (ref 0.57–1.00)
Globulin, Total: 2.4 g/dL (ref 1.5–4.5)
Glucose: 60 mg/dL — ABNORMAL LOW (ref 70–99)
Potassium: 5 mmol/L (ref 3.5–5.2)
Sodium: 140 mmol/L (ref 134–144)
Total Protein: 7.1 g/dL (ref 6.0–8.5)
eGFR: 91 mL/min/{1.73_m2} (ref >59)

## 2021-03-28 LAB — CBC WITH DIFFERENTIAL/PLATELET
Basophils Absolute: 0 10*3/uL (ref 0.0–0.2)
Basos: 1 %
EOS (ABSOLUTE): 0.1 10*3/uL (ref 0.0–0.4)
Eos: 1 %
Hematocrit: 46.2 % (ref 34.0–46.6)
Hemoglobin: 15.1 g/dL (ref 11.1–15.9)
Immature Grans (Abs): 0 10*3/uL (ref 0.0–0.1)
Immature Granulocytes: 0 %
Lymphocytes Absolute: 2.4 10*3/uL (ref 0.7–3.1)
Lymphs: 44 %
MCH: 30 pg (ref 26.6–33.0)
MCHC: 32.7 g/dL (ref 31.5–35.7)
MCV: 92 fL (ref 79–97)
Monocytes Absolute: 0.4 10*3/uL (ref 0.1–0.9)
Monocytes: 7 %
Neutrophils Absolute: 2.5 10*3/uL (ref 1.4–7.0)
Neutrophils: 47 %
Platelets: 245 10*3/uL (ref 150–450)
RBC: 5.03 x10E6/uL (ref 3.77–5.28)
RDW: 13.1 % (ref 11.7–15.4)
WBC: 5.4 10*3/uL (ref 3.4–10.8)

## 2021-03-28 LAB — THYROID PANEL WITH TSH
Free Thyroxine Index: 2.3 (ref 1.2–4.9)
T3 Uptake Ratio: 29 % (ref 24–39)
T4, Total: 7.9 ug/dL (ref 4.5–12.0)
TSH: 1.18 u[IU]/mL (ref 0.450–4.500)

## 2021-03-28 LAB — LIPID PANEL
Chol/HDL Ratio: 2.7 ratio (ref 0.0–4.4)
Cholesterol, Total: 240 mg/dL — ABNORMAL HIGH (ref 100–199)
HDL: 88 mg/dL (ref >39)
LDL Chol Calc (NIH): 141 mg/dL — ABNORMAL HIGH (ref 0–99)
Triglycerides: 67 mg/dL (ref 0–149)
VLDL Cholesterol Cal: 11 mg/dL (ref 5–40)

## 2021-03-28 LAB — CARDIOVASCULAR RISK ASSESSMENT

## 2021-04-09 ENCOUNTER — Other Ambulatory Visit: Payer: Self-pay

## 2021-04-09 ENCOUNTER — Other Ambulatory Visit: Payer: Self-pay | Admitting: Physician Assistant

## 2021-04-09 ENCOUNTER — Ambulatory Visit (INDEPENDENT_AMBULATORY_CARE_PROVIDER_SITE_OTHER): Payer: BC Managed Care – PPO | Admitting: Physician Assistant

## 2021-04-09 ENCOUNTER — Encounter: Payer: Self-pay | Admitting: Physician Assistant

## 2021-04-09 VITALS — BP 142/90 | HR 70 | Temp 97.8°F | Ht 68.0 in | Wt 154.0 lb

## 2021-04-09 DIAGNOSIS — Z0000 Encounter for general adult medical examination without abnormal findings: Secondary | ICD-10-CM | POA: Diagnosis not present

## 2021-04-09 DIAGNOSIS — I10 Essential (primary) hypertension: Secondary | ICD-10-CM | POA: Diagnosis not present

## 2021-04-09 DIAGNOSIS — Z1231 Encounter for screening mammogram for malignant neoplasm of breast: Secondary | ICD-10-CM

## 2021-04-09 LAB — POCT URINALYSIS DIP (CLINITEK)
Bilirubin, UA: NEGATIVE
Blood, UA: NEGATIVE
Glucose, UA: NEGATIVE mg/dL
Ketones, POC UA: NEGATIVE mg/dL
Leukocytes, UA: NEGATIVE
Nitrite, UA: NEGATIVE
POC PROTEIN,UA: NEGATIVE
Spec Grav, UA: 1.015 (ref 1.010–1.025)
Urobilinogen, UA: NEGATIVE E.U./dL — AB
pH, UA: 6 (ref 5.0–8.0)

## 2021-04-09 MED ORDER — LOSARTAN POTASSIUM 50 MG PO TABS: 50.0000 mg | ORAL_TABLET | Freq: Every day | ORAL | 2 refills | Status: DC

## 2021-04-09 MED ORDER — LORAZEPAM 1 MG PO TABS
1.0000 mg | ORAL_TABLET | Freq: Two times a day (BID) | ORAL | 1 refills | Status: DC | PRN
Start: 2021-04-09 — End: 2021-06-12

## 2021-04-09 NOTE — Progress Notes (Signed)
? ?Subjective:  ?Patient ID: Tammy [name], female    DOB: 11-19-1970  Age: 51 y.o. MRN: [id number] ? ?Chief Complaint  ?Patient presents with  ? Annual Exam  ? ? ?HPI Well Adult Physical: Patient here for a comprehensive physical exam.The patient reports  blood pressure still elevated - will adjust medication - she also has been having more episodes of heart 'beating hard and fast' - has appt with cardiology for evaluation 4/14 - recent labwork showed thyroid panel normal ?Do you take any herbs or supplements that were not prescribed by a doctor? no Are you taking calcium supplements? no Are you taking aspirin daily? no ? ?Encounter for general adult medical examination without abnormal findings  ?Physical ("At Risk" items are starred): Patient's last physical exam was 1 year ago .  ?Patient is not afflicted from Stress Incontinence and Urge Incontinence  ?Patient wears a seat belt, has smoke detectors, has carbon monoxide detectors, practices appropriate gun safety, and wears sunscreen with extended sun exposure. ?Dental Care: biannual cleanings, brushes and flosses daily. ?Ophthalmology/Optometry: Annual visit.  ?Hearing loss: none ?Vision impairments: none ? ?Up to date on mammogram, colonoscopy and pap smear ? ?Flowsheet Row Office Visit from 04/09/2021 in [facility]  ?PHQ-2 Total Score 0  ? ?  ?  ?  ?   ? ?  ?Social Hx   ?Social History  ? ?Socioeconomic History  ? Marital status: Married  ?  Spouse name: Not on file  ? Number of children: Not on file  ? Years of education: Not on file  ? Highest education level: Not on file  ?Occupational History  ? Not on file  ?Tobacco Use  ? Smoking status: Former  ?  Packs/day: 1.00  ?  Years: 25.00  ?  Pack years: 25.00  ?  Types: Cigarettes  ?  Quit date: 2018  ?  Years since quitting: 5.2  ? Smokeless tobacco: Never  ?Vaping Use  ? Vaping Use: Every day  ?Substance and Sexual Activity  ? Alcohol use: Yes  ?  Alcohol/week: 10.0 standard drinks  ?  Types:  10 Cans of beer per week  ?  Comment: SOCIAL  ? Drug use: No  ? Sexual activity: Yes  ?  Birth control/protection: Surgical  ?Other Topics Concern  ? Not on file  ?Social History Narrative  ? ** Merged History Encounter **  ?    ? ?Social Determinants of Health  ? ?Financial Resource Strain: Not on file  ?Food Insecurity: Not on file  ?Transportation Needs: Not on file  ?Physical Activity: Not on file  ?Stress: Not on file  ?Social Connections: Not on file  ? ?Past Medical History:  ?Diagnosis Date  ? Arthritis   ? Back pain   ? Nail fungus   ? ?Past Surgical History:  ?Procedure Laterality Date  ? ABDOMINAL HYSTERECTOMY    ? BACK SURGERY    ? BUNIONECTOMY    ? RIGHT FOOT  ? MAXIMUM ACCESS (MAS)POSTERIOR LUMBAR INTERBODY FUSION (PLIF) 3 LEVEL N/A 07/10/2016  ? Procedure: Lumbar two-Lumbar three - Lumbar three-Lumbar four - Lumbar four-Lumbar five MAXIMUM ACCESS (MAS) POSTERIOR LUMBAR INTERBODY FUSION;  Surgeon: Tia Alert, MD;  Location: [facility] OR;  Service: Neurosurgery;  Laterality: N/A;  ? TONSILLECTOMY    ? TUBAL LIGATION    ?  ?History reviewed. No pertinent family history. ? ?Review of Systems ?CONSTITUTIONAL: Negative for chills, fatigue, fever, unintentional weight gain and unintentional weight loss.  ?E/N/T: Negative  for ear pain, nasal congestion and sore throat.  ?CARDIOVASCULAR: see HPI ?RESPIRATORY: Negative for recent cough and dyspnea.  ?GASTROINTESTINAL: Negative for abdominal pain, acid reflux symptoms, constipation, diarrhea, nausea and vomiting.  ?MSK: Negative for arthralgias and myalgias.  ?INTEGUMENTARY: Negative for rash.  ?NEUROLOGICAL: Negative for dizziness and headaches.  ?PSYCHIATRIC: some anxiety - uses ativan prn ?   ? ? ?Objective:  ?BP (!) 142/90 (BP Location: Left Arm, Patient Position: Sitting)   Pulse 70   Temp 97.8 ?F (36.6 ?C) (Oral)   Ht 5\' 8"  (1.727 m)   Wt 154 lb (69.9 kg)   SpO2 100%   BMI 23.42 kg/m?  ? ?BP/Weight 04/09/2021 03/27/2021 01/18/2020  ?Systolic BP 142 148 115   ?Diastolic BP 90 92 86  ?Wt. (Lbs) 154 153 -  ?BMI 23.42 23.26 -  ? ? ?Physical Exam ?PHYSICAL EXAM:  ? ?VS: BP (!) 142/90 (BP Location: Left Arm, Patient Position: Sitting)   Pulse 70   Temp 97.8 ?F (36.6 ?C) (Oral)   Ht 5\' 8"  (1.727 m)   Wt 154 lb (69.9 kg)   SpO2 100%   BMI 23.42 kg/m?  ? ?GEN: Well nourished, well developed, in no acute distress  ?HEENT: normal external ears and nose - normal external auditory canals and TMS - hearing grossly normal - normal nasal mucosa and septum - Lips, Teeth and Gums - normal  ?Oropharynx - normal mucosa, palate, and posterior pharynx ?Neck: no JVD or masses - no thyromegaly ?Cardiac: RRR; no murmurs, rubs, or gallops,no edema - no significant varicosities ?Respiratory:  normal respiratory rate and pattern with no distress - normal breath sounds with no rales, rhonchi, wheezes or rubs ?GI: normal bowel sounds, no masses or tenderness ?MS: no deformity or atrophy  ?Skin: warm and dry, no rash  ?Neuro:  Alert and Oriented x 3, Strength and sensation are intact - CN II-Xii grossly intact ?Psych: euthymic mood, appropriate affect and demeanor ? ?Office Visit on 04/09/2021  ?Component Date Value Ref Range Status  ? Glucose, UA 04/09/2021 negative  negative mg/dL Final  ? Bilirubin, UA 04/09/2021 negative  negative Final  ? Ketones, POC UA 04/09/2021 negative  negative mg/dL Final  ? Spec Grav, UA 04/09/2021 1.015  1.010 - 1.025 Final  ? Blood, UA 04/09/2021 negative  negative Final  ? pH, UA 04/09/2021 6.0  5.0 - 8.0 Final  ? POC PROTEIN,UA 04/09/2021 negative  negative, trace Final  ? Urobilinogen, UA 04/09/2021 negative (A)  0.2 or 1.0 E.U./dL Final  ? Nitrite, UA 04/11/2021 Negative  Negative Final  ? Leukocytes, UA 04/09/2021 Negative  Negative Final  ? ? ?Lab Results  ?Component Value Date  ? WBC 5.4 03/27/2021  ? HGB 15.1 03/27/2021  ? HCT 46.2 03/27/2021  ? PLT 245 03/27/2021  ? GLUCOSE 60 (L) 03/27/2021  ? CHOL 240 (H) 03/27/2021  ? TRIG 67 03/27/2021  ? HDL 88  03/27/2021  ? LDLCALC 141 (H) 03/27/2021  ? ALT 14 03/27/2021  ? AST 16 03/27/2021  ? NA 140 03/27/2021  ? K 5.0 03/27/2021  ? CL 99 03/27/2021  ? CREATININE 0.78 03/27/2021  ? BUN 19 03/27/2021  ? CO2 28 03/27/2021  ? TSH 1.180 03/27/2021  ? INR 0.9 01/13/2020  ? ? ? ? ?Assessment & Plan:  ? ?Problem List Items Addressed This Visit   ?None ?Visit Diagnoses   ? ? Annual physical exam    -  Primary  ? Relevant Orders  ? POCT URINALYSIS DIP (  CLINITEK) (Completed)  ? Comprehensive metabolic panel  ? Benign hypertension      ? Relevant Medications  ? losartan (COZAAR) 50 MG tablet  ? ?  ? ?  ? ?Body mass index is 23.42 kg/m?.  ? ?These are the goals we discussed: ? Goals   ?None ?  ?  ? ?This is a list of the screening recommended for you and due dates:  ?Health Maintenance  ?Topic Date Due  ? Pap Smear  Never done  ? Colon Cancer Screening  Never done  ? Mammogram  Never done  ? Zoster (Shingles) Vaccine (1 of 2) 07/10/2021*  ? Tetanus Vaccine  04/10/2022*  ? HPV Vaccine  Aged Out  ? Flu Shot  Discontinued  ? COVID-19 Vaccine  Discontinued  ? Hepatitis C Screening: USPSTF Recommendation to screen - Ages 7318-79 yo.  Discontinued  ? HIV Screening  Discontinued  ?*Topic was postponed. The date shown is not the original due date.  ?  ? ?Meds ordered this encounter  ?Medications  ? losartan (COZAAR) 50 MG tablet  ?  Sig: Take 1 tablet (50 mg total) by mouth daily.  ?  Dispense:  30 tablet  ?  Refill:  2  ?  Order Specific Question:   Supervising Provider  ?  AnswerBlane Ohara:   COX, KIRSTEN [[id number]]  ? LORazepam (ATIVAN) 1 MG tablet  ?  Sig: Take 1 tablet (1 mg total) by mouth 2 (two) times daily as needed for anxiety.  ?  Dispense:  30 tablet  ?  Refill:  1  ?  Order Specific Question:   Supervising Provider  ?  AnswerBlane Ohara:   COX, KIRSTEN [[id number]]  ? ? ?Follow-up: Return in about 3 months (around 07/10/2021) for chronic fasting ---- 3 weeks for nurse visit bp check. ? ?An After Visit Summary was printed and given to the patient. ? ?SARA R  Stanton Kissoon, PA-C ?[facility] ?[phone] ? ?

## 2021-04-10 LAB — COMPREHENSIVE METABOLIC PANEL
ALT: 12 IU/L (ref 0–32)
AST: 12 IU/L (ref 0–40)
Albumin/Globulin Ratio: 2.3 — ABNORMAL HIGH (ref 1.2–2.2)
Albumin: 4.8 g/dL (ref 3.8–4.9)
Alkaline Phosphatase: 55 IU/L (ref 44–121)
BUN/Creatinine Ratio: 18 (ref 9–23)
BUN: 14 mg/dL (ref 6–24)
Bilirubin Total: 0.3 mg/dL (ref 0.0–1.2)
CO2: 27 mmol/L (ref 20–29)
Calcium: 9.6 mg/dL (ref 8.7–10.2)
Chloride: 100 mmol/L (ref 96–106)
Creatinine, Ser: 0.76 mg/dL (ref 0.57–1.00)
Globulin, Total: 2.1 g/dL (ref 1.5–4.5)
Glucose: 93 mg/dL (ref 70–99)
Potassium: 4.1 mmol/L (ref 3.5–5.2)
Sodium: 140 mmol/L (ref 134–144)
Total Protein: 6.9 g/dL (ref 6.0–8.5)
eGFR: 94 mL/min/{1.73_m2} (ref >59)

## 2021-04-16 ENCOUNTER — Encounter: Payer: Self-pay | Admitting: Physician Assistant

## 2021-04-16 ENCOUNTER — Telehealth: Payer: Self-pay

## 2021-04-16 ENCOUNTER — Other Ambulatory Visit: Payer: Self-pay | Admitting: Family Medicine

## 2021-04-16 MED ORDER — METOPROLOL SUCCINATE ER 25 MG PO TB24: 25.0000 mg | ORAL_TABLET | Freq: Every day | ORAL | 0 refills | Status: DC

## 2021-04-16 NOTE — Telephone Encounter (Signed)
Patient seen originally 3/7 for palpitations  and hypertension. She was placed on losartan and ativan at that time. 3/20 she had complete physical exam, losartan and ativan increased at that time. She is currently taking losartan 50 mg daily and ativan 1 mg as needed. This AM BP was 171/94. She has taken one tablet of ativan and feels this has not helped and is planning on taking another. She has a cardiology appointment 4/14 but is concerned this is too far out. Today she was transferring clothes from washer to dryer and heart felt like it was pounding. She does drink water, only caffeine consumption is one cup to one cup and half a day of coffee in the mornings. Symptoms have been effecting her work as she is concerned for her health. ? ?She is going to reach out to cardiology to see if she can be seen sooner. Please advise.  ?Callback: [phone] ? ?Tammy [name] 04/16/21 1:43 PM ? ?

## 2021-04-16 NOTE — Telephone Encounter (Signed)
Patient informed, patient stated that her pulse rate was 68 and that the patient was still concerned with the pounding sensation she is having in her chest 3-5 times daily with exertion. Per Blane Ohara, MD patient is to start Toprolol XL 25 mg 1 tablet daily and keep eye on pulse and bp and follow up with her first appointment with Cardiology in two weeks. Patient was also told that if she has any more concerns with her bp or questions don't hesitate to call us. [place] ?

## 2021-04-30 ENCOUNTER — Ambulatory Visit: Payer: BC Managed Care – PPO

## 2021-05-01 ENCOUNTER — Encounter: Payer: Self-pay | Admitting: Physician Assistant

## 2021-05-04 ENCOUNTER — Ambulatory Visit (INDEPENDENT_AMBULATORY_CARE_PROVIDER_SITE_OTHER): Payer: BC Managed Care – PPO

## 2021-05-04 ENCOUNTER — Encounter: Payer: Self-pay | Admitting: Cardiology

## 2021-05-04 ENCOUNTER — Ambulatory Visit (INDEPENDENT_AMBULATORY_CARE_PROVIDER_SITE_OTHER): Payer: BC Managed Care – PPO | Admitting: Cardiology

## 2021-05-04 VITALS — BP 80/60 | HR 61 | Ht 68.0 in | Wt 153.0 lb

## 2021-05-04 DIAGNOSIS — E785 Hyperlipidemia, unspecified: Secondary | ICD-10-CM | POA: Diagnosis not present

## 2021-05-04 DIAGNOSIS — R0609 Other forms of dyspnea: Secondary | ICD-10-CM

## 2021-05-04 DIAGNOSIS — R002 Palpitations: Secondary | ICD-10-CM

## 2021-05-04 MED ORDER — LOSARTAN POTASSIUM 25 MG PO TABS: 25.0000 mg | ORAL_TABLET | Freq: Every day | ORAL | 0 refills | Status: DC

## 2021-05-04 NOTE — Progress Notes (Signed)
? ?Cardiology Consultation:   ? ?Date:  05/04/2021  ? ?ID:  Tammy [name], DOB 01-04-1971, MRN [id number] ? ?PCP:  Marianne Sofia, PA-C  ?Cardiologist:  Gypsy Balsam, MD  ? ?Referring MD: Marianne Sofia, PA-C  ? ?Chief Complaint  ?Patient presents with  ? Palpitations  ? Tachycardia  ? ? ?History of Present Illness:   ? ?Tammy [name] is a 51 y.o. female who is being seen today for the evaluation of palpitations at the request of Marianne Sofia, [place].  Past medical history significant for palpitations that she has been having for last few months, dyslipidemia, dyspnea on exertion.  She was referred to [place] because of palpitations.  For the last few months she described to have very powerful pounding in the chest that happen typically at evening time with some irregularities.  Not exactly tachycardia.  She is very anxious about this but there is no shortness of breath no sweating associated with this sensation.  Otherwise she seems to be doing well she does get shortness of breath with exam exertion but otherwise doing well.  She used to smoke but quit now she vapes, she used to exercise on the regular basis but because of back pain she have difficulty doing it and she admits that during the winter she did not do any exercises ? ?Past Medical History:  ?Diagnosis Date  ? Acute nonintractable headache 01/21/2021  ? Arthritis   ? Back pain   ? Close exposure to COVID-19 virus 01/21/2021  ? Degeneration of lumbar intervertebral disc 02/27/2016  ? Displacement of lumbar intervertebral disc without myelopathy 02/27/2016  ? Idiopathic scoliosis 02/27/2016  ? Low back pain 02/27/2016  ? Nail fungus   ? 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  ? S/P lumbar fusion 01/17/2020  ? S/P lumbar spinal fusion 07/10/2016  ? ? ?Past Surgical History:  ?Procedure Laterality Date  ? ABDOMINAL HYSTERECTOMY    ? BACK SURGERY    ? BUNIONECTOMY    ? RIGHT FOOT  ? MAXIMUM ACCESS (MAS)POSTERIOR LUMBAR INTERBODY FUSION (PLIF) 3 LEVEL N/A 07/10/2016  ? Procedure:  Lumbar two-Lumbar three - Lumbar three-Lumbar four - Lumbar four-Lumbar five MAXIMUM ACCESS (MAS) POSTERIOR LUMBAR INTERBODY FUSION;  Surgeon: Tia Alert, MD;  Location: [facility] OR;  Service: Neurosurgery;  Laterality: N/A;  ? TONSILLECTOMY    ? TUBAL LIGATION    ? ? ?Current Medications: ?Current Meds  ?Medication Sig  ? LORazepam (ATIVAN) 1 MG tablet Take 1 tablet (1 mg total) by mouth 2 (two) times daily as needed for anxiety.  ? losartan (COZAAR) 25 MG tablet Take 1 tablet (25 mg total) by mouth daily.  ? metoprolol succinate (TOPROL-XL) 25 MG 24 hr tablet Take 1 tablet (25 mg total) by mouth daily.  ? [DISCONTINUED] losartan (COZAAR) 50 MG tablet Take 1 tablet (50 mg total) by mouth daily.  ?  ? ?Allergies:   Patient has no known allergies.  ? ?Social History  ? ?Socioeconomic History  ? Marital status: Married  ?  Spouse name: Not on file  ? Number of children: Not on file  ? Years of education: Not on file  ? Highest education level: Not on file  ?Occupational History  ? Not on file  ?Tobacco Use  ? Smoking status: Former  ?  Packs/day: 1.00  ?  Years: 25.00  ?  Pack years: 25.00  ?  Types: Cigarettes  ?  Quit date: 2018  ?  Years since quitting: 5.2  ? Smokeless tobacco: Never  ?  Vaping Use  ? Vaping Use: Every day  ?Substance and Sexual Activity  ? Alcohol use: Yes  ?  Alcohol/week: 10.0 standard drinks  ?  Types: 10 Cans of beer per week  ?  Comment: SOCIAL  ? Drug use: No  ? Sexual activity: Yes  ?  Birth control/protection: Surgical  ?Other Topics Concern  ? Not on file  ?Social History Narrative  ? ** Merged History Encounter **  ?    ? ?Social Determinants of Health  ? ?Financial Resource Strain: Not on file  ?Food Insecurity: Not on file  ?Transportation Needs: Not on file  ?Physical Activity: Not on file  ?Stress: Not on file  ?Social Connections: Not on file  ?  ? ?Family History: ?The patient's family history includes Heart disease in her father; Heart murmur in her father; Hypercholesterolemia in  her father and sister; Hypertension in her mother and sister; Ovarian cancer in her mother; Stroke in her mother and sister. ?ROS:   ?Please see the history of present illness.    ?All 14 point review of systems negative except as described per history of present illness. ? ?EKGs/Labs/Other Studies Reviewed:   ? ?The following studies were reviewed today: ? ? ?EKG:  EKG is  ordered today.  The ekg ordered today demonstrates sinus rhythm with short PR interval otherwise normal QS complex duration fulgent no ST segment changes ? ?Recent Labs: ?03/27/2021: Hemoglobin 15.1; Platelets 245; TSH 1.180 ?04/09/2021: ALT 12; BUN 14; Creatinine, Ser 0.76; Potassium 4.1; Sodium 140  ?Recent Lipid Panel ?   ?Component Value Date/Time  ? CHOL 240 (H) 03/27/2021 0930  ? TRIG 67 03/27/2021 0930  ? HDL 88 03/27/2021 0930  ? CHOLHDL 2.7 03/27/2021 0930  ? LDLCALC 141 (H) 03/27/2021 0930  ? ? ?Physical Exam:   ? ?VS:  BP (!) 80/60 (BP Location: Left Arm, Patient Position: Sitting, Cuff Size: Normal)   Pulse 61   Ht 5\' 8"  (1.727 m)   Wt 153 lb (69.4 kg)   SpO2 98%   BMI 23.26 kg/m?    ? ?Wt Readings from Last 3 Encounters:  ?05/04/21 153 lb (69.4 kg)  ?04/09/21 154 lb (69.9 kg)  ?03/27/21 153 lb (69.4 kg)  ?  ? ?GEN:  Well nourished, well developed in no acute distress ?HEENT: Normal ?NECK: No JVD; No carotid bruits ?LYMPHATICS: No lymphadenopathy ?CARDIAC: RRR, no murmurs, no rubs, no gallops ?RESPIRATORY:  Clear to auscultation without rales, wheezing or rhonchi  ?ABDOMEN: Soft, non-tender, non-distended ?MUSCULOSKELETAL:  No edema; No deformity  ?SKIN: Warm and dry ?NEUROLOGIC:  Alert and oriented x 3 ?PSYCHIATRIC:  Normal affect  ? ?ASSESSMENT:   ? ?1. Palpitations   ?2. Dyslipidemia   ?3. Dyspnea on exertion   ? ?PLAN:   ? ?In order of problems listed above: ? ?Palpitations.  I will ask her to wear Zio patch to see exactly what of arrhythmia we dealing with.  She was given metoprolol 25 mg daily with very good control I asked  her to wear a monitor for 1 week and there is no palpitations stop metoprolol for 1 week to see we can see exactly what kind of arrhythmia we dealing with.  As a part of ablation she also will have echocardiogram performed to assess left ventricle ejection fraction. ?Essential hypertension she is taking losartan however her blood pressure today is low I asked her to cut losartan in half she will be taking only 25 mg daily. ?Dyslipidemia I did review  her K PN which show me HDL 88 8 LDL 141 I calculated 10 years predicted risk which was only 0.8%.  Therefore diet and exercises is recommended.  She is worried about potential coronary artery disease at all.  In the future we may consider a calcium score to assess her risk is better. ? ? ?Medication Adjustments/Labs and Tests Ordered: ?Current medicines are reviewed at length with the patient today.  Concerns regarding medicines are outlined above.  ?Orders Placed This Encounter  ?Procedures  ? LONG TERM MONITOR (3-14 DAYS)  ? EKG 12-Lead  ? ECHOCARDIOGRAM COMPLETE  ? ?Meds ordered this encounter  ?Medications  ? losartan (COZAAR) 25 MG tablet  ?  Sig: Take 1 tablet (25 mg total) by mouth daily.  ?  Dispense:  90 tablet  ?  Refill:  0  ? ? ?Signed, ?Georgeanna Leaobert J. Alexi Dorminey, MD, [facility]. ?05/04/2021 11:44 AM    ?[facility] ? ?

## 2021-05-04 NOTE — Patient Instructions (Addendum)
Medication Instructions:  ?Your physician has recommended you make the following change in your medication:  ?DECREASE LOSARTAN 50mg  to 25mg  daily.  You may take 1/2 of current dose and then your next refill you will take 1 tablet by mouth daily. ? ? ?Lab Work: ?None Ordered ?If you have labs (blood work) drawn today and your tests are completely normal, you will receive your results only by: ?MyChart Message (if you have MyChart) OR ?A paper copy in the mail ?If you have any lab test that is abnormal or we need to change your treatment, we will call you to review the results. ? ? ?Testing/Procedures: ?Your physician has requested that you have an echocardiogram.  ?Echocardiography is a painless test that uses sound waves to create images of your heart. It provides your doctor with information about the size and shape of your heart and how well your heart?s chambers and valves are working. This procedure takes approximately one hour. There are no restrictions for this procedure.  ? ? ?WHY IS MY DOCTOR PRESCRIBING ZIO? ?The Zio system is proven and trusted by physicians to detect and diagnose irregular heart rhythms -- and has been prescribed to hundreds of thousands of patients. ? ?The FDA has cleared the Zio system to monitor for many different kinds of irregular heart rhythms. In a study, physicians were able to reach a diagnosis 90% of the time with the Zio system1. ? ?You can wear the Zio monitor -- a small, discreet, comfortable patch -- during your normal day-to-day activity, including while you sleep, shower, and exercise, while it records every single heartbeat for analysis. ? ?1Barrett, P., et al. Comparison of 24 Hour Holter Monitoring Versus 14 Day Novel Adhesive Patch Electrocardiographic Monitoring. [place], 2014. ? ?ZIO VS. HOLTER MONITORING ?The Zio monitor can be comfortably worn for up to 14 days. Holter monitors can be worn for 24 to 48 hours, limiting the time to record any  irregular heart rhythms you may have. Zio is able to capture data for the 51% of patients who have their first symptom-triggered arrhythmia after 48 hours.1 ? ?LIVE WITHOUT RESTRICTIONS ?The Zio ambulatory cardiac monitor is a small, unobtrusive, and water-resistant patch--you might even forget you?re wearing it. The Zio monitor records and stores every beat of your heart, whether you're sleeping, working out, or showering.   ? ? ?Follow-Up: ?At [facility], you and your health needs are our priority.  As part of our continuing mission to provide you with exceptional heart care, we have created designated Provider Care Teams.  These Care Teams include your primary Cardiologist (physician) and Advanced Practice Providers (APPs -  Physician Assistants and Nurse Practitioners) who all work together to provide you with the care you need, when you need it. ? ?We recommend signing up for the patient portal called "MyChart".  Sign up information is provided on this After Visit Summary.  MyChart is used to connect with patients for Virtual Visits (Telemedicine).  Patients are able to view lab/test results, encounter notes, upcoming appointments, etc.  Non-urgent messages can be sent to your provider as well.   ?To learn more about what you can do with MyChart, go to [email or web address].   ? ?Your next appointment:   ?2 month(s) ? ?The format for your next appointment:   ?In Person ? ?Provider:   ?Jenne Campus, MD  ? ? ?Other Instructions ?None ? ?Important Information About Sugar ? ? ? ? ?  ?

## 2021-05-15 ENCOUNTER — Ambulatory Visit (INDEPENDENT_AMBULATORY_CARE_PROVIDER_SITE_OTHER): Payer: BC Managed Care – PPO

## 2021-05-15 DIAGNOSIS — I517 Cardiomegaly: Secondary | ICD-10-CM | POA: Diagnosis not present

## 2021-05-15 DIAGNOSIS — R002 Palpitations: Secondary | ICD-10-CM

## 2021-05-15 DIAGNOSIS — I361 Nonrheumatic tricuspid (valve) insufficiency: Secondary | ICD-10-CM | POA: Diagnosis not present

## 2021-05-15 LAB — ECHOCARDIOGRAM COMPLETE
Area-P 1/2: 2.57 cm2
S' Lateral: 3.5 cm

## 2021-05-18 DIAGNOSIS — Z1231 Encounter for screening mammogram for malignant neoplasm of breast: Secondary | ICD-10-CM | POA: Diagnosis not present

## 2021-05-21 ENCOUNTER — Other Ambulatory Visit: Payer: Self-pay

## 2021-05-21 MED ORDER — METOPROLOL SUCCINATE ER 25 MG PO TB24: 25.0000 mg | ORAL_TABLET | Freq: Every day | ORAL | 0 refills | Status: DC

## 2021-05-22 ENCOUNTER — Other Ambulatory Visit: Payer: Self-pay | Admitting: Physician Assistant

## 2021-05-22 DIAGNOSIS — R928 Other abnormal and inconclusive findings on diagnostic imaging of breast: Secondary | ICD-10-CM

## 2021-05-29 ENCOUNTER — Telehealth: Payer: Self-pay | Admitting: Physician Assistant

## 2021-05-29 NOTE — Telephone Encounter (Signed)
? ?  Tammy [name] has been scheduled for the following appointment: ? ?WHAT: DIAGNOSTIC MAMMOGRAM ?WHERE: [facility] ?DATE: 06/07/21 ?TIME: 1:30 PM CHECK IN  ? ?Patient has been made aware. ? ?

## 2021-05-30 ENCOUNTER — Telehealth: Payer: Self-pay

## 2021-05-30 MED ORDER — METOPROLOL SUCCINATE ER 50 MG PO TB24: 50.0000 mg | ORAL_TABLET | Freq: Every day | ORAL | 2 refills | Status: DC

## 2021-05-30 NOTE — Telephone Encounter (Signed)
-----   Message from Georgeanna Lea, MD sent at 05/29/2021  9:05 PM EDT ----- ?Multiple triggered events for PVCs she is already on metoprolol 25 daily if she is willing we can increase to 50 mg every single day ?

## 2021-05-30 NOTE — Telephone Encounter (Signed)
Patient notified of results.

## 2021-05-30 NOTE — Telephone Encounter (Signed)
Patient is aware of the following per Dr. Bing Matter ?

## 2021-05-30 NOTE — Telephone Encounter (Signed)
Patient notified of results and recommendations and agreed with plan. Rx sent. She is aware she can double on her current script of 25mg  until she runs out then she will take one a day when she starts the the script for 50mg  I just sent.  ?

## 2021-05-30 NOTE — Telephone Encounter (Signed)
-----   Message from Georgeanna Lea, MD sent at 05/30/2021  2:17 PM EDT ----- ?No, trivial tricuspid regurgitation is not a problem and should never create a problem.  This is not what responsible for her palpitations ?----- Message ----- ?From: Neena Rhymes, RN ?Sent: 05/17/2021  12:43 PM EDT ?To: Georgeanna Lea, MD ? ?Spoke with pt. She wanted to know if the "Trivial mitral valve regurgitation" could be of any problems in th future and if it could be causing her palpitations. If not what could be the cause of the palpitations. ? ?

## 2021-05-30 NOTE — Telephone Encounter (Signed)
-----   Message from Robert J Krasowski, MD sent at 05/29/2021  9:05 PM EDT ----- ?Multiple triggered events for PVCs she is already on metoprolol 25 daily if she is willing we can increase to 50 mg every single day ?

## 2021-06-05 DIAGNOSIS — J349 Unspecified disorder of nose and nasal sinuses: Secondary | ICD-10-CM | POA: Diagnosis not present

## 2021-06-05 DIAGNOSIS — J069 Acute upper respiratory infection, unspecified: Secondary | ICD-10-CM | POA: Diagnosis not present

## 2021-06-06 DIAGNOSIS — R928 Other abnormal and inconclusive findings on diagnostic imaging of breast: Secondary | ICD-10-CM | POA: Diagnosis not present

## 2021-06-06 DIAGNOSIS — R922 Inconclusive mammogram: Secondary | ICD-10-CM | POA: Diagnosis not present

## 2021-06-12 ENCOUNTER — Other Ambulatory Visit: Payer: Self-pay

## 2021-06-12 DIAGNOSIS — M549 Dorsalgia, unspecified: Secondary | ICD-10-CM | POA: Diagnosis not present

## 2021-06-12 MED ORDER — LORAZEPAM 1 MG PO TABS: 1.0000 mg | ORAL_TABLET | Freq: Two times a day (BID) | ORAL | 1 refills | Status: DC | PRN

## 2021-06-12 NOTE — Telephone Encounter (Signed)
Refill request for ativan.  RX requested to be sent to [facility].

## 2021-06-22 DIAGNOSIS — R928 Other abnormal and inconclusive findings on diagnostic imaging of breast: Secondary | ICD-10-CM | POA: Diagnosis not present

## 2021-06-22 DIAGNOSIS — N6489 Other specified disorders of breast: Secondary | ICD-10-CM | POA: Diagnosis not present

## 2021-06-22 DIAGNOSIS — N6021 Fibroadenosis of right breast: Secondary | ICD-10-CM | POA: Diagnosis not present

## 2021-06-22 DIAGNOSIS — N6031 Fibrosclerosis of right breast: Secondary | ICD-10-CM | POA: Diagnosis not present

## 2021-06-22 DIAGNOSIS — R921 Mammographic calcification found on diagnostic imaging of breast: Secondary | ICD-10-CM | POA: Diagnosis not present

## 2021-07-10 ENCOUNTER — Telehealth: Payer: Self-pay | Admitting: Cardiology

## 2021-07-10 ENCOUNTER — Ambulatory Visit: Payer: BC Managed Care – PPO | Admitting: Cardiology

## 2021-07-10 ENCOUNTER — Other Ambulatory Visit: Payer: Self-pay

## 2021-07-10 MED ORDER — METOPROLOL SUCCINATE ER 50 MG PO TB24: 50.0000 mg | ORAL_TABLET | Freq: Every day | ORAL | 3 refills | Status: DC

## 2021-07-10 NOTE — Telephone Encounter (Signed)
*  STAT* If patient is at the pharmacy, call can be transferred to refill team.   1. Which medications need to be refilled? (please list name of each medication and dose if known)  metoprolol succinate (TOPROL-XL) 50 MG 24 hr tablet  2. Which pharmacy/location (including street and city if local pharmacy) is medication to be sent to? [facility] - [address] [place], [place] [address]  3. Do they need a 30 day or 90 day supply?  90 day supply

## 2021-07-20 ENCOUNTER — Ambulatory Visit (INDEPENDENT_AMBULATORY_CARE_PROVIDER_SITE_OTHER): Payer: BC Managed Care – PPO | Admitting: Physician Assistant

## 2021-07-20 ENCOUNTER — Encounter: Payer: Self-pay | Admitting: Physician Assistant

## 2021-07-20 VITALS — BP 112/72 | HR 60 | Temp 97.8°F | Resp 18 | Ht 68.0 in | Wt 147.6 lb

## 2021-07-20 DIAGNOSIS — F419 Anxiety disorder, unspecified: Secondary | ICD-10-CM | POA: Diagnosis not present

## 2021-07-20 DIAGNOSIS — E782 Mixed hyperlipidemia: Secondary | ICD-10-CM | POA: Diagnosis not present

## 2021-07-20 DIAGNOSIS — I10 Essential (primary) hypertension: Secondary | ICD-10-CM

## 2021-07-20 MED ORDER — LORAZEPAM 1 MG PO TABS: 1.0000 mg | ORAL_TABLET | Freq: Every day | ORAL | 1 refills | Status: DC

## 2021-07-20 MED ORDER — LOSARTAN POTASSIUM 25 MG PO TABS: 25.0000 mg | ORAL_TABLET | Freq: Every day | ORAL | 1 refills | Status: DC

## 2021-07-20 MED ORDER — METOPROLOL SUCCINATE ER 50 MG PO TB24: 50.0000 mg | ORAL_TABLET | Freq: Every day | ORAL | 1 refills | Status: DC

## 2021-07-20 MED ORDER — CYCLOBENZAPRINE HCL 10 MG PO TABS: 10.0000 mg | ORAL_TABLET | Freq: Three times a day (TID) | ORAL | 0 refills | Status: DC | PRN

## 2021-07-26 NOTE — Progress Notes (Signed)
Subjective:  Patient ID: Tammy [name], female    DOB: 1970/11/16  Age: 51 y.o. MRN: [id number]  Chief Complaint  Patient presents with   Hyperlipidemia    HPI  Pt presents for follow up of hypertension. The patient is tolerating the medication well without side effects. Compliance with treatment has been good; including taking medication as directed , maintains a healthy diet and regular exercise regimen , and following up as directed. She is currently on losartan and toprol  Pt with history of elevated lipids - she has had a cardiology consult and workup and states cardiologist has recommended not to start statin at this time  Pt with history of anxiety - stable on ativan  No current outpatient medications on file prior to visit.   No current facility-administered medications on file prior to visit.   Past Medical History:  Diagnosis Date   Acute nonintractable headache 01/21/2021   Arthritis    Back pain    Close exposure to COVID-19 virus 01/21/2021   Degeneration of lumbar intervertebral disc 02/27/2016   Displacement of lumbar intervertebral disc without myelopathy 02/27/2016   Idiopathic scoliosis 02/27/2016   Low back pain 02/27/2016   Nail fungus    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   S/P lumbar fusion 01/17/2020   S/P lumbar spinal fusion 07/10/2016   Past Surgical History:  Procedure Laterality Date   ABDOMINAL HYSTERECTOMY     BACK SURGERY     BUNIONECTOMY     RIGHT FOOT   MAXIMUM ACCESS (MAS)POSTERIOR LUMBAR INTERBODY FUSION (PLIF) 3 LEVEL N/A 07/10/2016   Procedure: Lumbar two-Lumbar three - Lumbar three-Lumbar four - Lumbar four-Lumbar five MAXIMUM ACCESS (MAS) POSTERIOR LUMBAR INTERBODY FUSION;  Surgeon: Tia Alert, MD;  Location: [facility] OR;  Service: Neurosurgery;  Laterality: N/A;   TONSILLECTOMY     TUBAL LIGATION      Family History  Problem Relation Age of Onset   Stroke Mother    Hypertension Mother    Ovarian cancer Mother    Hypercholesterolemia Father     Heart murmur Father    Heart disease Father    Hypertension Sister    Hypercholesterolemia Sister    Stroke Sister    Social History   Socioeconomic History   Marital status: Married    Spouse name: Not on file   Number of children: Not on file   Years of education: Not on file   Highest education level: Not on file  Occupational History   Not on file  Tobacco Use   Smoking status: Former    Packs/day: 1.00    Years: 25.00    Total pack years: 25.00    Types: Cigarettes    Quit date: 2018    Years since quitting: 5.5   Smokeless tobacco: Never  Vaping Use   Vaping Use: Every day  Substance and Sexual Activity   Alcohol use: Yes    Alcohol/week: 10.0 standard drinks of alcohol    Types: 10 Cans of beer per week    Comment: SOCIAL   Drug use: No   Sexual activity: Yes    Birth control/protection: Surgical  Other Topics Concern   Not on file  Social History Narrative   ** Merged History Encounter **       Social Determinants of Health   Financial Resource Strain: Not on file  Food Insecurity: Not on file  Transportation Needs: Not on file  Physical Activity: Not on file  Stress: Not on file  Social  Connections: Not on file    Review of Systems CONSTITUTIONAL: Negative for chills, fatigue, fever, unintentional weight gain and unintentional weight loss.   CARDIOVASCULAR: Negative for chest pain, dizziness, palpitations and pedal edema.  RESPIRATORY: Negative for recent cough and dyspnea.  GASTROINTESTINAL: Negative for abdominal pain, acid reflux symptoms, constipation, diarrhea, nausea and vomiting.   PSYCHIATRIC: Negative for sleep disturbance and to question depression screen.  Negative for depression, negative for anhedonia.       Objective:  BP 112/72   Pulse 60   Temp 97.8 F (36.6 C)   Resp 18   Ht 5\' 8"  (1.727 m)   Wt 147 lb 9.6 oz (67 kg)   SpO2 99%   BMI 22.44 kg/m      07/20/2021    9:40 AM 05/04/2021   10:59 AM 05/04/2021   10:44 AM   BP/Weight  Systolic BP 112 80 92  Diastolic BP 72 60 80  Wt. (Lbs) 147.6  153  BMI 22.44 kg/m2  23.26 kg/m2    Physical Exam PHYSICAL EXAM:   VS: BP 112/72   Pulse 60   Temp 97.8 F (36.6 C)   Resp 18   Ht 5\' 8"  (1.727 m)   Wt 147 lb 9.6 oz (67 kg)   SpO2 99%   BMI 22.44 kg/m   GEN: Well nourished, well developed, in no acute distress   Cardiac: RRR; no murmurs, rubs, or gallops,no edema -  Respiratory:  normal respiratory rate and pattern with no distress - normal breath sounds with no rales, rhonchi, wheezes or rubs  Psych: euthymic mood, appropriate affect and demeanor  Diabetic Foot Exam - Simple   No data filed      Lab Results  Component Value Date   WBC 5.4 03/27/2021   HGB 15.1 03/27/2021   HCT 46.2 03/27/2021   PLT 245 03/27/2021   GLUCOSE 93 04/09/2021   CHOL 240 (H) 03/27/2021   TRIG 67 03/27/2021   HDL 88 03/27/2021   LDLCALC 141 (H) 03/27/2021   ALT 12 04/09/2021   AST 12 04/09/2021   NA 140 04/09/2021   K 4.1 04/09/2021   CL 100 04/09/2021   CREATININE 0.76 04/09/2021   BUN 14 04/09/2021   CO2 27 04/09/2021   TSH 1.180 03/27/2021   INR 0.9 01/13/2020      Assessment & Plan:   Problem List Items Addressed This Visit   None Visit Diagnoses     Benign hypertension    -  Primary   Relevant Medications   losartan (COZAAR) 25 MG tablet   metoprolol succinate (TOPROL-XL) 50 MG 24 hr tablet Continue meds   Anxiety       Relevant Medications   LORazepam (ATIVAN) 1 MG tablet Continue meds   Mixed hyperlipidemia       Relevant Medications   losartan (COZAAR) 25 MG tablet   metoprolol succinate (TOPROL-XL) 50 MG 24 hr tablet Watch diet     .  Meds ordered this encounter  Medications   LORazepam (ATIVAN) 1 MG tablet    Sig: Take 1 tablet (1 mg total) by mouth at bedtime.    Dispense:  30 tablet    Refill:  1    Order Specific Question:   Supervising Provider    Answer:   05/27/2021   losartan (COZAAR) 25 MG tablet     Sig: Take 1 tablet (25 mg total) by mouth daily.    Dispense:  90 tablet  Refill:  1    Order Specific Question:   Supervising Provider    Answer:   Corey Harold   metoprolol succinate (TOPROL-XL) 50 MG 24 hr tablet    Sig: Take 1 tablet (50 mg total) by mouth daily. Take with or immediately following a meal.    Dispense:  90 tablet    Refill:  1    Order Specific Question:   Supervising Provider    Answer:   Corey Harold   cyclobenzaprine (FLEXERIL) 10 MG tablet    Sig: Take 1 tablet (10 mg total) by mouth 3 (three) times daily as needed for muscle spasms.    Dispense:  90 tablet    Refill:  0    Order Specific Question:   Supervising Provider    Answer:   Corey Harold    No orders of the defined types were placed in this encounter.    Follow-up: Return in about 6 months (around 01/19/2022) for chronic fasting follow up.  An After Visit Summary was printed and given to the patient.  Jettie Pagan [facility] [phone]

## 2021-08-08 ENCOUNTER — Other Ambulatory Visit: Payer: Self-pay

## 2021-08-08 DIAGNOSIS — R928 Other abnormal and inconclusive findings on diagnostic imaging of breast: Secondary | ICD-10-CM

## 2021-08-08 MED ORDER — LORAZEPAM 1 MG PO TABS: 1.0000 mg | ORAL_TABLET | Freq: Every day | ORAL | 1 refills | Status: DC

## 2021-08-20 IMAGING — CR DG CHEST 2V
2 series · 2 of 2 positions shown · non-contrast
Comparison: June 27, 2016

CLINICAL DATA: Preoperative lumbar surgery

EXAM:
CHEST - 2 VIEW

[w chest pa]
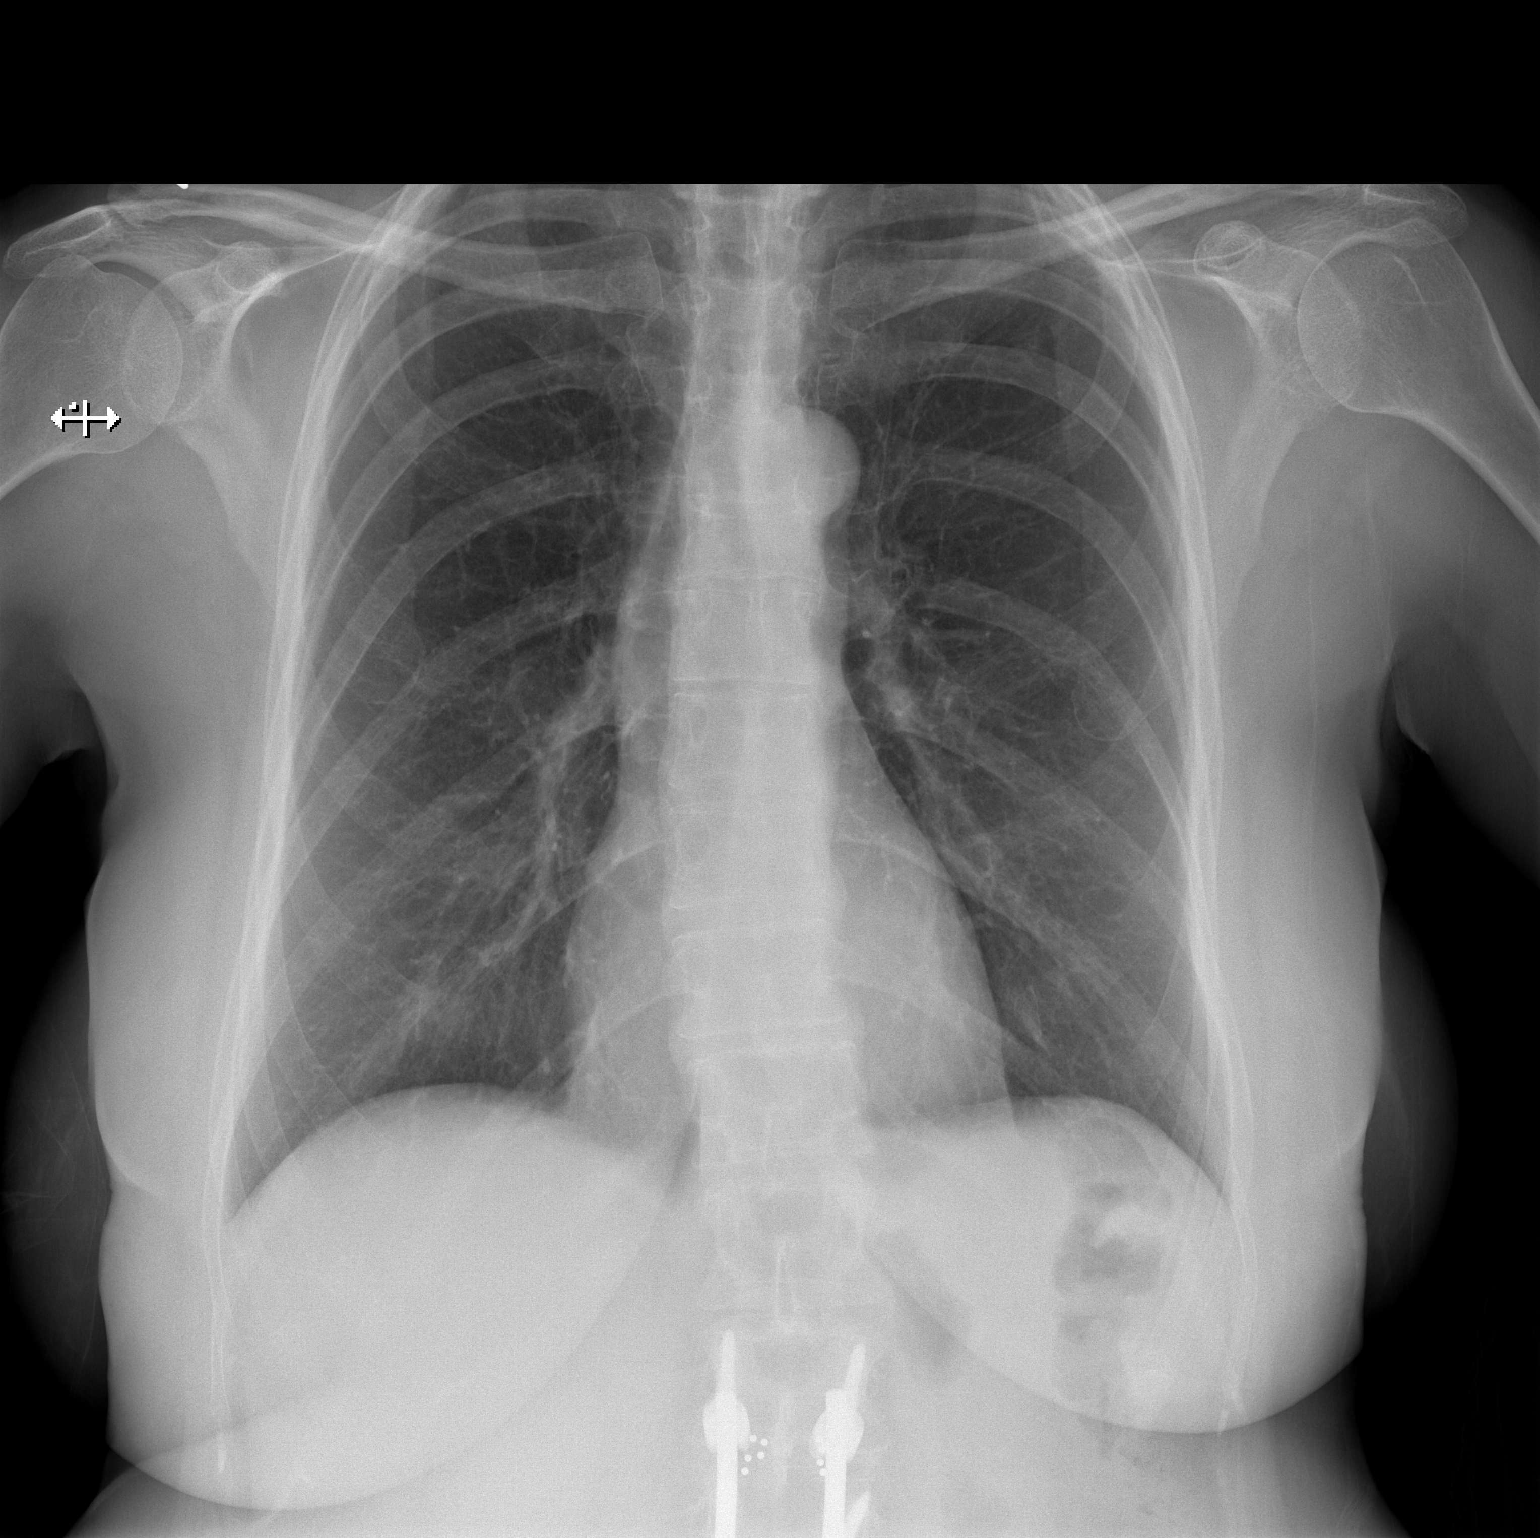

[w chest lat]
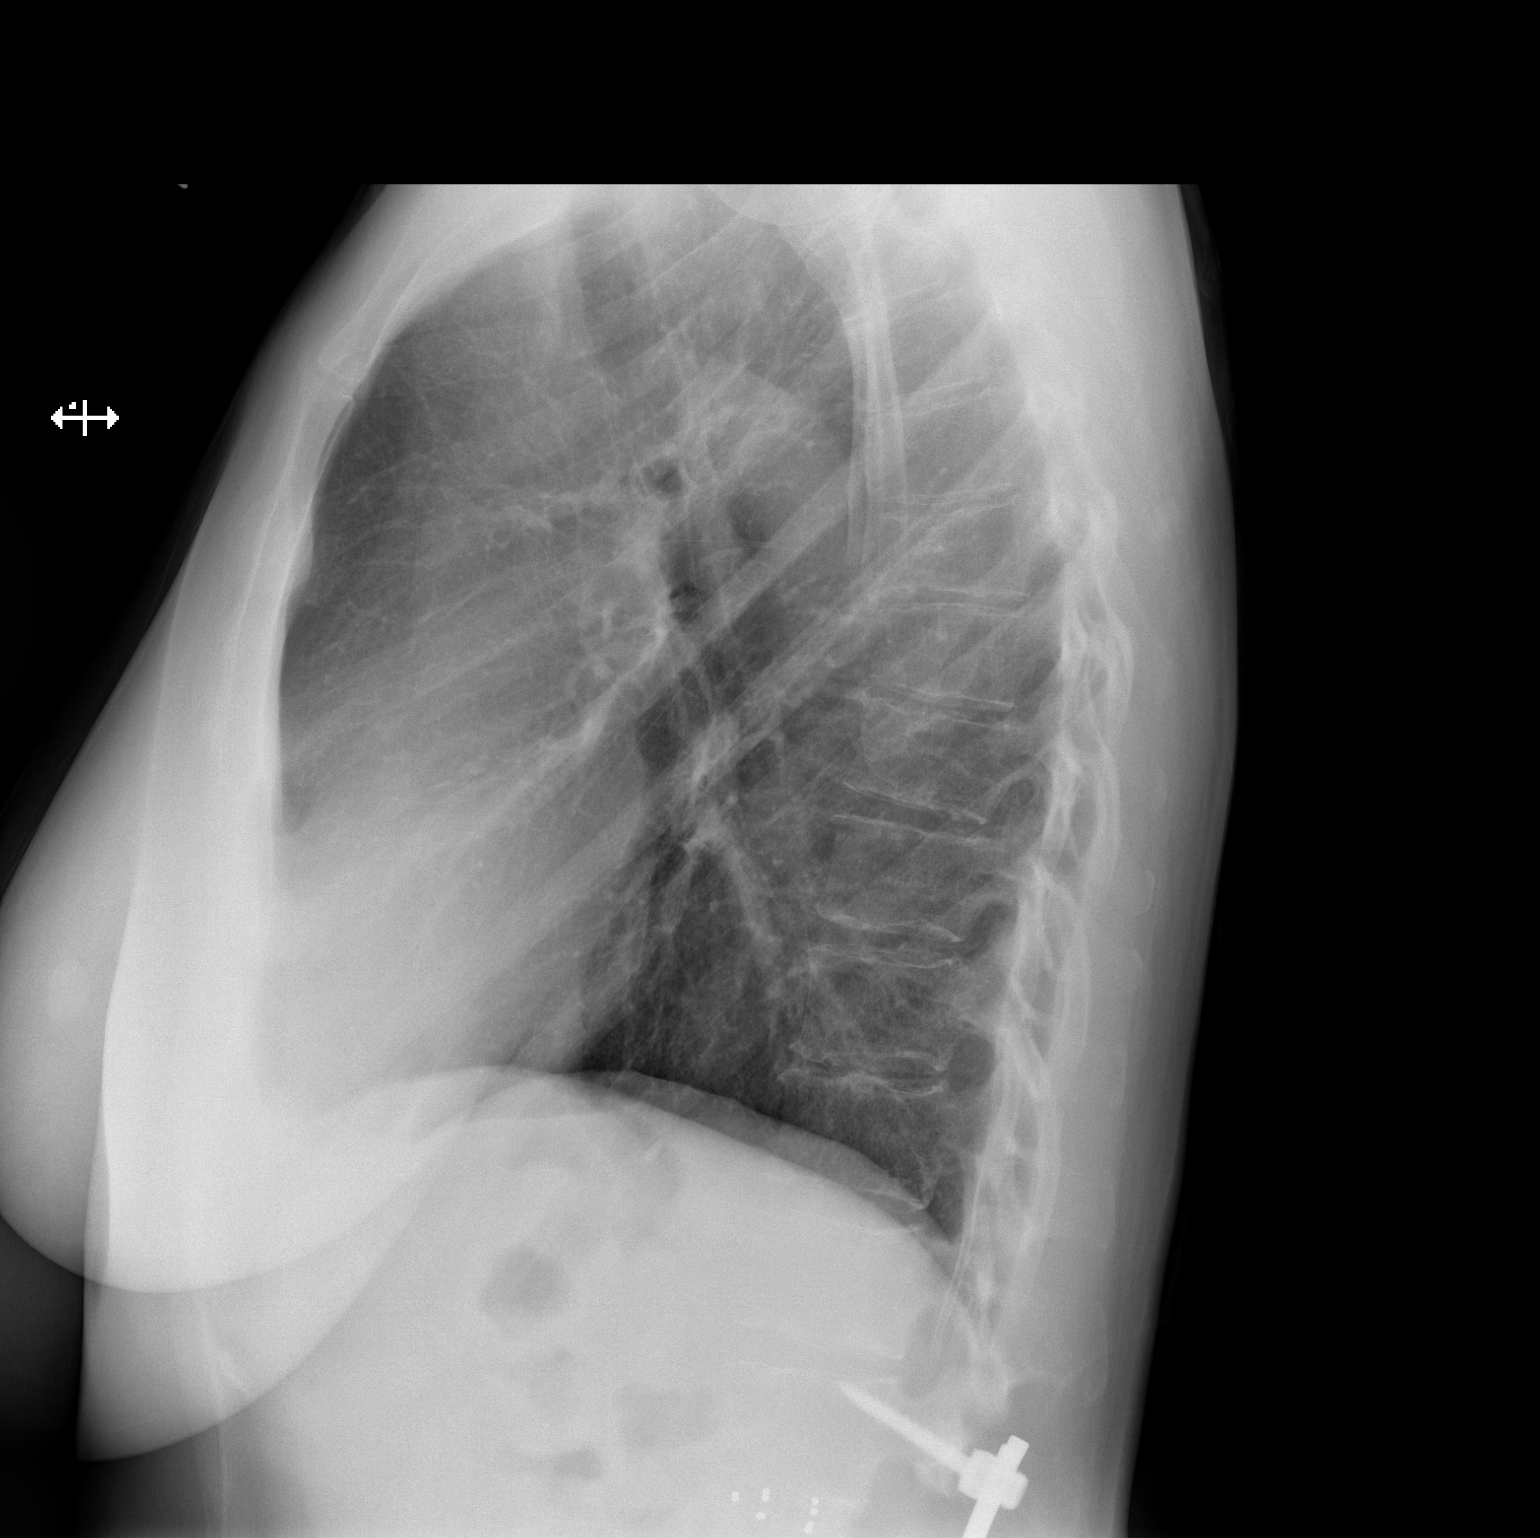

[2 of 2 positions shown; findings below may reference images not displayed]

FINDINGS: Lungs are clear. Heart size and pulmonary vascularity are normal. No
adenopathy. There is postoperative change in the lumbar spine
region. There is thoracolumbar levoscoliosis.
IMPRESSION: Lungs clear.  Cardiac silhouette normal.

## 2021-08-21 ENCOUNTER — Ambulatory Visit (INDEPENDENT_AMBULATORY_CARE_PROVIDER_SITE_OTHER): Payer: BC Managed Care – PPO | Admitting: Cardiology

## 2021-08-21 ENCOUNTER — Encounter: Payer: Self-pay | Admitting: Cardiology

## 2021-08-21 VITALS — BP 124/74 | HR 68 | Ht 67.5 in | Wt 152.0 lb

## 2021-08-21 DIAGNOSIS — R002 Palpitations: Secondary | ICD-10-CM

## 2021-08-21 DIAGNOSIS — E785 Hyperlipidemia, unspecified: Secondary | ICD-10-CM | POA: Diagnosis not present

## 2021-08-21 DIAGNOSIS — R0609 Other forms of dyspnea: Secondary | ICD-10-CM

## 2021-08-21 NOTE — Progress Notes (Signed)
Cardiology Office Note:    Date:  08/21/2021   ID:  Tammy [name], DOB Jul 03, 1970, MRN [id number]  PCP:  Marianne Sofia, PA-C  Cardiologist:  Gypsy Balsam, MD    Referring MD: Marianne Sofia, PA-C   Chief Complaint  Patient presents with   Follow-up  Doing fine  History of Present Illness:    Tammy [name] is a 51 y.o. female with past medical history significant for palpitations, some chronic back problem, dyslipidemia, dyspnea on exertion.  She did have echocardiogram performed which showed normal left ventricle ejection fraction structurally normal heart, she wore monitor as well which show symptomatic PVCs with total burden of PVCs being less than 1%.  She was put on a small dose of beta-blocker seems to be doing better from palpitation point review.  She denies have any chest pain tightness squeezing pressure burning chest.  She does have some bruising in her forearm arm and that is what bothers her.  I offered her a CBC however she said she is going to have annual physical done within the next few weeks and she will have blood test there.  She thinks that this is related to metoprolol told her this is very unlikely  Past Medical History:  Diagnosis Date   Acute nonintractable headache 01/21/2021   Arthritis    Back pain    Close exposure to COVID-19 virus 01/21/2021   Degeneration of lumbar intervertebral disc 02/27/2016   Displacement of lumbar intervertebral disc without myelopathy 02/27/2016   Idiopathic scoliosis 02/27/2016   Low back pain 02/27/2016   Nail fungus    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   S/P lumbar fusion 01/17/2020   S/P lumbar spinal fusion 07/10/2016    Past Surgical History:  Procedure Laterality Date   ABDOMINAL HYSTERECTOMY     BACK SURGERY     BUNIONECTOMY     RIGHT FOOT   MAXIMUM ACCESS (MAS)POSTERIOR LUMBAR INTERBODY FUSION (PLIF) 3 LEVEL N/A 07/10/2016   Procedure: Lumbar two-Lumbar three - Lumbar three-Lumbar four - Lumbar four-Lumbar five MAXIMUM  ACCESS (MAS) POSTERIOR LUMBAR INTERBODY FUSION;  Surgeon: Tia Alert, MD;  Location: [facility];  Service: Neurosurgery;  Laterality: N/A;   TONSILLECTOMY     TUBAL LIGATION      Current Medications: Current Meds  Medication Sig   cyclobenzaprine (FLEXERIL) 10 MG tablet Take 1 tablet (10 mg total) by mouth 3 (three) times daily as needed for muscle spasms.   LORazepam (ATIVAN) 1 MG tablet Take 1 tablet (1 mg total) by mouth at bedtime.   losartan (COZAAR) 25 MG tablet Take 1 tablet (25 mg total) by mouth daily.   metoprolol succinate (TOPROL-XL) 50 MG 24 hr tablet Take 1 tablet (50 mg total) by mouth daily. Take with or immediately following a meal.     Allergies:   Patient has no known allergies.   Social History   Socioeconomic History   Marital status: Married    Spouse name: Not on file   Number of children: Not on file   Years of education: Not on file   Highest education level: Not on file  Occupational History   Not on file  Tobacco Use   Smoking status: Former    Packs/day: 1.00    Years: 25.00    Total pack years: 25.00    Types: Cigarettes    Quit date: 2018    Years since quitting: 5.5   Smokeless tobacco: Never  Vaping Use   Vaping Use: Every day  Substance and Sexual Activity   Alcohol use: Yes    Alcohol/week: 10.0 standard drinks of alcohol    Types: 10 Cans of beer per week    Comment: SOCIAL   Drug use: No   Sexual activity: Yes    Birth control/protection: Surgical  Other Topics Concern   Not on file  Social History Narrative   ** Merged History Encounter **       Social Determinants of Health   Financial Resource Strain: Not on file  Food Insecurity: Not on file  Transportation Needs: Not on file  Physical Activity: Not on file  Stress: Not on file  Social Connections: Not on file     Family History: The patient's family history includes Heart disease in her father; Heart murmur in her father; Hypercholesterolemia in her father and  sister; Hypertension in her mother and sister; Ovarian cancer in her mother; Stroke in her mother and sister. ROS:   Please see the history of present illness.    All 14 point review of systems negative except as described per history of present illness  EKGs/Labs/Other Studies Reviewed:      Recent Labs: 03/27/2021: Hemoglobin 15.1; Platelets 245; TSH 1.180 04/09/2021: ALT 12; BUN 14; Creatinine, Ser 0.76; Potassium 4.1; Sodium 140  Recent Lipid Panel    Component Value Date/Time   CHOL 240 (H) 03/27/2021 0930   TRIG 67 03/27/2021 0930   HDL 88 03/27/2021 0930   CHOLHDL 2.7 03/27/2021 0930   LDLCALC 141 (H) 03/27/2021 0930    Physical Exam:    VS:  BP 124/74 (BP Location: Left Arm, Patient Position: Sitting)   Pulse 68   Ht 5' 7.5" (1.715 m)   Wt 152 lb (68.9 kg)   SpO2 95%   BMI 23.46 kg/m     Wt Readings from Last 3 Encounters:  08/21/21 152 lb (68.9 kg)  07/20/21 147 lb 9.6 oz (67 kg)  05/04/21 153 lb (69.4 kg)     GEN:  Well nourished, well developed in no acute distress HEENT: Normal NECK: No JVD; No carotid bruits LYMPHATICS: No lymphadenopathy CARDIAC: RRR, no murmurs, no rubs, no gallops RESPIRATORY:  Clear to auscultation without rales, wheezing or rhonchi  ABDOMEN: Soft, non-tender, non-distended MUSCULOSKELETAL:  No edema; No deformity  SKIN: Warm and dry [name] EXTREMITIES: no swelling NEUROLOGIC:  Alert and oriented x 3 PSYCHIATRIC:  Normal affect   ASSESSMENT:    1. Palpitations   2. Dyslipidemia   3. Dyspnea on exertion    PLAN:    In order of problems listed above:  Palpitations doing well with metoprolol continue present management. Dyslipidemia I did review K PN which show me LDL 141 last time we calculate her 10 years predicted risk which is only 0.8%.  Diet and need to exercise on the regular basis has been stressed Dyspnea on exertion: Doing well from that point   Medication Adjustments/Labs and Tests Ordered: Current medicines are  reviewed at length with the patient today.  Concerns regarding medicines are outlined above.  No orders of the defined types were placed in this encounter.  Medication changes: No orders of the defined types were placed in this encounter.   Signed, Georgeanna Lea, MD, [facility] 08/21/2021 3:45 PM    [facility]

## 2021-08-21 NOTE — Patient Instructions (Signed)
Medication Instructions:  Your physician recommends that you continue on your current medications as directed. Please refer to the Current Medication list given to you today.  *If you need a refill on your cardiac medications before your next appointment, please call your pharmacy*   Lab Work: NONE If you have labs (blood work) drawn today and your tests are completely normal, you will receive your results only by: MyChart Message (if you have MyChart) OR A paper copy in the mail If you have any lab test that is abnormal or we need to change your treatment, we will call you to review the results.   Testing/Procedures: NONE   Follow-Up: At [facility], you and your health needs are our priority.  As part of our continuing mission to provide you with exceptional heart care, we have created designated Provider Care Teams.  These Care Teams include your primary Cardiologist (physician) and Advanced Practice Providers (APPs -  Physician Assistants and Nurse Practitioners) who all work together to provide you with the care you need, when you need it.  We recommend signing up for the patient portal called "MyChart".  Sign up information is provided on this After Visit Summary.  MyChart is used to connect with patients for Virtual Visits (Telemedicine).  Patients are able to view lab/test results, encounter notes, upcoming appointments, etc.  Non-urgent messages can be sent to your provider as well.   To learn more about what you can do with MyChart, go to [email or web address].    Your next appointment:   1 year(s)  The format for your next appointment:   In Person  Provider:   Robert Krasowski, MD    Other Instructions   Important Information About Sugar       

## 2021-09-26 ENCOUNTER — Other Ambulatory Visit: Payer: Self-pay | Admitting: Physician Assistant

## 2021-10-04 DIAGNOSIS — R079 Chest pain, unspecified: Secondary | ICD-10-CM | POA: Diagnosis not present

## 2021-10-04 DIAGNOSIS — R918 Other nonspecific abnormal finding of lung field: Secondary | ICD-10-CM | POA: Diagnosis not present

## 2021-10-04 DIAGNOSIS — Z79899 Other long term (current) drug therapy: Secondary | ICD-10-CM | POA: Diagnosis not present

## 2021-10-04 DIAGNOSIS — F17210 Nicotine dependence, cigarettes, uncomplicated: Secondary | ICD-10-CM | POA: Diagnosis not present

## 2021-10-04 DIAGNOSIS — I10 Essential (primary) hypertension: Secondary | ICD-10-CM | POA: Diagnosis not present

## 2021-10-04 DIAGNOSIS — I700 Atherosclerosis of aorta: Secondary | ICD-10-CM | POA: Diagnosis not present

## 2021-10-04 DIAGNOSIS — F419 Anxiety disorder, unspecified: Secondary | ICD-10-CM | POA: Diagnosis not present

## 2021-10-04 DIAGNOSIS — I249 Acute ischemic heart disease, unspecified: Secondary | ICD-10-CM | POA: Diagnosis not present

## 2021-10-04 DIAGNOSIS — F17290 Nicotine dependence, other tobacco product, uncomplicated: Secondary | ICD-10-CM | POA: Diagnosis not present

## 2021-10-05 DIAGNOSIS — F419 Anxiety disorder, unspecified: Secondary | ICD-10-CM | POA: Diagnosis not present

## 2021-10-05 DIAGNOSIS — I10 Essential (primary) hypertension: Secondary | ICD-10-CM | POA: Diagnosis not present

## 2021-10-05 DIAGNOSIS — R079 Chest pain, unspecified: Secondary | ICD-10-CM | POA: Diagnosis not present

## 2021-10-09 ENCOUNTER — Other Ambulatory Visit: Payer: Self-pay

## 2021-10-09 MED ORDER — LORAZEPAM 1 MG PO TABS: 1.0000 mg | ORAL_TABLET | Freq: Every day | ORAL | 1 refills | Status: DC

## 2021-10-10 ENCOUNTER — Encounter: Payer: Self-pay | Admitting: Physician Assistant

## 2021-10-10 ENCOUNTER — Ambulatory Visit (INDEPENDENT_AMBULATORY_CARE_PROVIDER_SITE_OTHER): Payer: BC Managed Care – PPO | Admitting: Physician Assistant

## 2021-10-10 VITALS — BP 122/82 | HR 74 | Temp 97.2°F | Ht 67.5 in | Wt 154.5 lb

## 2021-10-10 DIAGNOSIS — R0789 Other chest pain: Secondary | ICD-10-CM

## 2021-10-10 DIAGNOSIS — F419 Anxiety disorder, unspecified: Secondary | ICD-10-CM | POA: Diagnosis not present

## 2021-10-10 MED ORDER — BUSPIRONE HCL 5 MG PO TABS: 5.0000 mg | ORAL_TABLET | Freq: Two times a day (BID) | ORAL | 2 refills | Status: DC

## 2021-10-10 NOTE — Progress Notes (Signed)
Subjective:  Patient ID: Tammy [name], female    DOB: [birth year]  Age: 51 y.o. MRN: [id number]  Chief Complaint  Patient presents with   Hospitalization Follow-up    HPI  Pt was admitted to hospital last week and kept overnight for evaluation of chest pain She states that she was riding in her car and had a sharp midepigastric pain that was about 3-4 minutes then she felt weak and had tingling in arms and upper legs. She had a negative workup including normal EKG, normal CTA and normal stress test She has not had any further pains since that time She denies any stomach pain, nausea or vomiting  Pt states that she has been having a lot of anxiety and stress with her current job situation as well as having recurrent low back pain and will be following up with her neurosurgeon She currently is taking ativan qhs but is having symptoms throughout the day Current Outpatient Medications on File Prior to Visit  Medication Sig Dispense Refill   cyclobenzaprine (FLEXERIL) 10 MG tablet TAKE ONE TABLET BY MOUTH 3 TIMES DAILY FOR MUSCLE SPASMS 90 tablet 0   LORazepam (ATIVAN) 1 MG tablet Take 1 tablet (1 mg total) by mouth at bedtime. 30 tablet 1   losartan (COZAAR) 25 MG tablet Take 1 tablet (25 mg total) by mouth daily. 90 tablet 1   metoprolol succinate (TOPROL-XL) 50 MG 24 hr tablet Take 1 tablet (50 mg total) by mouth daily. Take with or immediately following a meal. 90 tablet 1   No current facility-administered medications on file prior to visit.   Past Medical History:  Diagnosis Date   Acute nonintractable headache 01/21/2021   Arthritis    Back pain    Close exposure to COVID-19 virus 01/21/2021   Degeneration of lumbar intervertebral disc 02/27/2016   Displacement of lumbar intervertebral disc without myelopathy 02/27/2016   Idiopathic scoliosis 02/27/2016   Low back pain 02/27/2016   Nail fungus    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   S/P lumbar fusion 01/17/2020   S/P lumbar spinal fusion  07/10/2016   Past Surgical History:  Procedure Laterality Date   ABDOMINAL HYSTERECTOMY     BACK SURGERY     BUNIONECTOMY     RIGHT FOOT   MAXIMUM ACCESS (MAS)POSTERIOR LUMBAR INTERBODY FUSION (PLIF) 3 LEVEL N/A 07/10/2016   Procedure: Lumbar two-Lumbar three - Lumbar three-Lumbar four - Lumbar four-Lumbar five MAXIMUM ACCESS (MAS) POSTERIOR LUMBAR INTERBODY FUSION;  Surgeon: Eustace Moore, MD;  Location: [place];  Service: Neurosurgery;  Laterality: N/A;   TONSILLECTOMY     TUBAL LIGATION      Family History  Problem Relation Age of Onset   Stroke Mother    Hypertension Mother    Ovarian cancer Mother    Hypercholesterolemia Father    Heart murmur Father    Heart disease Father    Hypertension Sister    Hypercholesterolemia Sister    Stroke Sister    Social History   Socioeconomic History   Marital status: Married    Spouse name: Not on file   Number of children: Not on file   Years of education: Not on file   Highest education level: Not on file  Occupational History   Not on file  Tobacco Use   Smoking status: Former    Packs/day: 1.00    Years: 25.00    Total pack years: 25.00    Types: Cigarettes    Quit date: 2018  Years since quitting: 5.7   Smokeless tobacco: Never  Vaping Use   Vaping Use: Every day  Substance and Sexual Activity   Alcohol use: Yes    Alcohol/week: 10.0 standard drinks of alcohol    Types: 10 Cans of beer per week    Comment: SOCIAL   Drug use: No   Sexual activity: Yes    Birth control/protection: Surgical  Other Topics Concern   Not on file  Social History Narrative   ** Merged History Encounter **       Social Determinants of Health   Financial Resource Strain: Not on file  Food Insecurity: Not on file  Transportation Needs: Not on file  Physical Activity: Not on file  Stress: Not on file  Social Connections: Not on file    Review of Systems  CONSTITUTIONAL: Negative for chills, fatigue, fever, unintentional weight  gain and unintentional weight loss.  E/N/T: Negative for ear pain, nasal congestion and sore throat.  CARDIOVASCULAR:see HPI RESPIRATORY: Negative for recent cough and dyspnea.  GASTROINTESTINAL: Negative for abdominal pain, acid reflux symptoms, constipation, diarrhea, nausea and vomiting.  MSK: see HPI INTEGUMENTARY: Negative for rash.  NEUROLOGICAL: Negative for dizziness and headaches.  PSYCHIATRIC: see HPI     Objective:  PHYSICAL EXAM:   VS: BP 122/82 (BP Location: Left Arm, Patient Position: Sitting, Cuff Size: Normal)   Pulse 74   Temp (!) 97.2 F (36.2 C) (Temporal)   Ht 5' 7.5" (1.715 m)   Wt 154 lb 8 oz (70.1 kg)   SpO2 94%   BMI 23.84 kg/m   GEN: Well nourished, well developed, in no acute distress  Cardiac: RRR; no murmurs Respiratory:  normal respiratory rate and pattern with no distress - normal breath sounds with no rales, rhonchi, wheezes or rubs  Psych: euthymic mood, appropriate affect and demeanor  Lab Results  Component Value Date   WBC 5.4 03/27/2021   HGB 15.1 03/27/2021   HCT 46.2 03/27/2021   PLT 245 03/27/2021   GLUCOSE 93 04/09/2021   CHOL 240 (H) 03/27/2021   TRIG 67 03/27/2021   HDL 88 03/27/2021   LDLCALC 141 (H) 03/27/2021   ALT 12 04/09/2021   AST 12 04/09/2021   NA 140 04/09/2021   K 4.1 04/09/2021   CL 100 04/09/2021   CREATININE 0.76 04/09/2021   BUN 14 04/09/2021   CO2 27 04/09/2021   TSH 1.180 03/27/2021   INR 0.9 01/13/2020      Assessment & Plan:   Problem List Items Addressed This Visit   None Visit Diagnoses     Atypical chest pain    -  Primary Recommend cardiology follow up if symptoms persist   Anxiety       Relevant Medications   busPIRone (BUSPAR) 5 MG tablet Continue ativan as directed     .  Meds ordered this encounter  Medications   busPIRone (BUSPAR) 5 MG tablet    Sig: Take 1 tablet (5 mg total) by mouth 2 (two) times daily.    Dispense:  60 tablet    Refill:  2    Order Specific Question:    Supervising Provider    Answer:   Shelton Silvas    No orders of the defined types were placed in this encounter.    Follow-up: Return for as scheduled in january.  An After Visit Summary was printed and given to the patient.  Yetta Flock [facility] [phone]

## 2021-10-11 ENCOUNTER — Encounter: Payer: Self-pay | Admitting: Physician Assistant

## 2021-11-15 ENCOUNTER — Other Ambulatory Visit: Payer: Self-pay | Admitting: Physician Assistant

## 2021-11-15 DIAGNOSIS — G8929 Other chronic pain: Secondary | ICD-10-CM | POA: Diagnosis not present

## 2021-11-15 DIAGNOSIS — M4126 Other idiopathic scoliosis, lumbar region: Secondary | ICD-10-CM | POA: Diagnosis not present

## 2021-11-15 MED ORDER — LOSARTAN POTASSIUM 25 MG PO TABS: 25.0000 mg | ORAL_TABLET | Freq: Every day | ORAL | 1 refills | Status: DC

## 2021-11-19 ENCOUNTER — Other Ambulatory Visit: Payer: Self-pay | Admitting: Neurological Surgery

## 2021-11-19 DIAGNOSIS — M4126 Other idiopathic scoliosis, lumbar region: Secondary | ICD-10-CM

## 2021-11-27 ENCOUNTER — Ambulatory Visit
Admission: RE | Admit: 2021-11-27 | Discharge: 2021-11-27 | Disposition: A | Discharge status: Home / Self Care | Payer: BC Managed Care – PPO | Source: Ambulatory Visit | Attending: Neurological Surgery | Admitting: Neurological Surgery

## 2021-11-27 DIAGNOSIS — M545 Low back pain, unspecified: Secondary | ICD-10-CM | POA: Diagnosis not present

## 2021-11-27 DIAGNOSIS — M8588 Other specified disorders of bone density and structure, other site: Secondary | ICD-10-CM | POA: Diagnosis not present

## 2021-11-27 DIAGNOSIS — M4126 Other idiopathic scoliosis, lumbar region: Secondary | ICD-10-CM

## 2021-12-19 DIAGNOSIS — M545 Low back pain, unspecified: Secondary | ICD-10-CM | POA: Diagnosis not present

## 2021-12-21 DIAGNOSIS — M961 Postlaminectomy syndrome, not elsewhere classified: Secondary | ICD-10-CM | POA: Diagnosis not present

## 2021-12-21 DIAGNOSIS — R531 Weakness: Secondary | ICD-10-CM | POA: Diagnosis not present

## 2021-12-21 DIAGNOSIS — M545 Low back pain, unspecified: Secondary | ICD-10-CM | POA: Diagnosis not present

## 2021-12-21 DIAGNOSIS — M2560 Stiffness of unspecified joint, not elsewhere classified: Secondary | ICD-10-CM | POA: Diagnosis not present

## 2021-12-24 DIAGNOSIS — M961 Postlaminectomy syndrome, not elsewhere classified: Secondary | ICD-10-CM | POA: Diagnosis not present

## 2021-12-24 DIAGNOSIS — M2560 Stiffness of unspecified joint, not elsewhere classified: Secondary | ICD-10-CM | POA: Diagnosis not present

## 2021-12-24 DIAGNOSIS — R531 Weakness: Secondary | ICD-10-CM | POA: Diagnosis not present

## 2021-12-24 DIAGNOSIS — M545 Low back pain, unspecified: Secondary | ICD-10-CM | POA: Diagnosis not present

## 2021-12-26 DIAGNOSIS — M2560 Stiffness of unspecified joint, not elsewhere classified: Secondary | ICD-10-CM | POA: Diagnosis not present

## 2021-12-26 DIAGNOSIS — R531 Weakness: Secondary | ICD-10-CM | POA: Diagnosis not present

## 2021-12-26 DIAGNOSIS — M545 Low back pain, unspecified: Secondary | ICD-10-CM | POA: Diagnosis not present

## 2021-12-26 DIAGNOSIS — M961 Postlaminectomy syndrome, not elsewhere classified: Secondary | ICD-10-CM | POA: Diagnosis not present

## 2021-12-31 DIAGNOSIS — M545 Low back pain, unspecified: Secondary | ICD-10-CM | POA: Diagnosis not present

## 2021-12-31 DIAGNOSIS — R531 Weakness: Secondary | ICD-10-CM | POA: Diagnosis not present

## 2021-12-31 DIAGNOSIS — M961 Postlaminectomy syndrome, not elsewhere classified: Secondary | ICD-10-CM | POA: Diagnosis not present

## 2021-12-31 DIAGNOSIS — M2560 Stiffness of unspecified joint, not elsewhere classified: Secondary | ICD-10-CM | POA: Diagnosis not present

## 2022-01-02 ENCOUNTER — Other Ambulatory Visit: Payer: Self-pay

## 2022-01-02 DIAGNOSIS — M2560 Stiffness of unspecified joint, not elsewhere classified: Secondary | ICD-10-CM | POA: Diagnosis not present

## 2022-01-02 DIAGNOSIS — M545 Low back pain, unspecified: Secondary | ICD-10-CM | POA: Diagnosis not present

## 2022-01-02 DIAGNOSIS — R531 Weakness: Secondary | ICD-10-CM | POA: Diagnosis not present

## 2022-01-02 DIAGNOSIS — M961 Postlaminectomy syndrome, not elsewhere classified: Secondary | ICD-10-CM | POA: Diagnosis not present

## 2022-01-02 MED ORDER — CYCLOBENZAPRINE HCL 10 MG PO TABS: ORAL_TABLET | ORAL | 0 refills | Status: DC

## 2022-01-07 ENCOUNTER — Other Ambulatory Visit: Payer: Self-pay | Admitting: Physician Assistant

## 2022-01-07 DIAGNOSIS — M545 Low back pain, unspecified: Secondary | ICD-10-CM | POA: Diagnosis not present

## 2022-01-07 DIAGNOSIS — M961 Postlaminectomy syndrome, not elsewhere classified: Secondary | ICD-10-CM | POA: Diagnosis not present

## 2022-01-07 DIAGNOSIS — M2560 Stiffness of unspecified joint, not elsewhere classified: Secondary | ICD-10-CM | POA: Diagnosis not present

## 2022-01-07 DIAGNOSIS — R531 Weakness: Secondary | ICD-10-CM | POA: Diagnosis not present

## 2022-01-09 DIAGNOSIS — M2560 Stiffness of unspecified joint, not elsewhere classified: Secondary | ICD-10-CM | POA: Diagnosis not present

## 2022-01-09 DIAGNOSIS — M545 Low back pain, unspecified: Secondary | ICD-10-CM | POA: Diagnosis not present

## 2022-01-09 DIAGNOSIS — R531 Weakness: Secondary | ICD-10-CM | POA: Diagnosis not present

## 2022-01-09 DIAGNOSIS — M961 Postlaminectomy syndrome, not elsewhere classified: Secondary | ICD-10-CM | POA: Diagnosis not present

## 2022-01-22 DIAGNOSIS — M545 Low back pain, unspecified: Secondary | ICD-10-CM | POA: Diagnosis not present

## 2022-01-22 DIAGNOSIS — M2560 Stiffness of unspecified joint, not elsewhere classified: Secondary | ICD-10-CM | POA: Diagnosis not present

## 2022-01-22 DIAGNOSIS — R531 Weakness: Secondary | ICD-10-CM | POA: Diagnosis not present

## 2022-01-22 DIAGNOSIS — M961 Postlaminectomy syndrome, not elsewhere classified: Secondary | ICD-10-CM | POA: Diagnosis not present

## 2022-01-25 DIAGNOSIS — M545 Low back pain, unspecified: Secondary | ICD-10-CM | POA: Diagnosis not present

## 2022-01-25 DIAGNOSIS — M2560 Stiffness of unspecified joint, not elsewhere classified: Secondary | ICD-10-CM | POA: Diagnosis not present

## 2022-01-25 DIAGNOSIS — M961 Postlaminectomy syndrome, not elsewhere classified: Secondary | ICD-10-CM | POA: Diagnosis not present

## 2022-01-25 DIAGNOSIS — R531 Weakness: Secondary | ICD-10-CM | POA: Diagnosis not present

## 2022-01-28 ENCOUNTER — Ambulatory Visit (INDEPENDENT_AMBULATORY_CARE_PROVIDER_SITE_OTHER): Payer: BC Managed Care – PPO | Admitting: Physician Assistant

## 2022-01-28 ENCOUNTER — Encounter: Payer: Self-pay | Admitting: Physician Assistant

## 2022-01-28 VITALS — BP 118/80 | HR 74 | Temp 97.1°F | Ht 67.5 in | Wt 156.6 lb

## 2022-01-28 DIAGNOSIS — Z23 Encounter for immunization: Secondary | ICD-10-CM | POA: Diagnosis not present

## 2022-01-28 DIAGNOSIS — I10 Essential (primary) hypertension: Secondary | ICD-10-CM

## 2022-01-28 DIAGNOSIS — E782 Mixed hyperlipidemia: Secondary | ICD-10-CM | POA: Diagnosis not present

## 2022-01-28 DIAGNOSIS — M545 Low back pain, unspecified: Secondary | ICD-10-CM

## 2022-01-28 DIAGNOSIS — G8929 Other chronic pain: Secondary | ICD-10-CM

## 2022-01-28 NOTE — Progress Notes (Signed)
Subjective:  Patient ID: Tammy [name], female    DOB: 06-Sep-1970  Age: 52 y.o. MRN: [id number]  Chief Complaint  Patient presents with   Hypertension      Pt presents for follow up of hypertension. The patient is tolerating the medication well without side effects. Compliance with treatment has been good; including taking medication as directed , maintains a healthy diet and regular exercise regimen , and following up as directed. She is currently on losartan 25mg  - she actually took herself off metoprolol  Pt with history of elevated lipids - she has had a cardiology consult and workup and states cardiologist has recommended not to start statin at this time Would like to have labwork done today  Pt with history of anxiety - stable on ativan which she uses as needed  Pt with history of chronic low back pain - she does follow with neurosurgeon.  She states that her last surgery did not end up fusing her lower vertebrae after all - she is in physical therapy at this time which is seem to be helping her pain  Pt requests flu shot today  Current Outpatient Medications on File Prior to Visit  Medication Sig Dispense Refill   cyclobenzaprine (FLEXERIL) 10 MG tablet TAKE ONE TABLET BY MOUTH 3 TIMES DAILY FOR MUSCLE SPASMS 90 tablet 0   LORazepam (ATIVAN) 1 MG tablet TAKE ONE TABLET BY MOUTH AT BEDTIME 30 tablet 1   losartan (COZAAR) 25 MG tablet Take 1 tablet (25 mg total) by mouth daily. 90 tablet 1   No current facility-administered medications on file prior to visit.   Past Medical History:  Diagnosis Date   Acute nonintractable headache 01/21/2021   Arthritis    Back pain    Close exposure to COVID-19 virus 01/21/2021   Degeneration of lumbar intervertebral disc 02/27/2016   Displacement of lumbar intervertebral disc without myelopathy 02/27/2016   Idiopathic scoliosis 02/27/2016   Low back pain 02/27/2016   Nail fungus    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   S/P lumbar fusion 01/17/2020    S/P lumbar spinal fusion 07/10/2016   Past Surgical History:  Procedure Laterality Date   ABDOMINAL HYSTERECTOMY     BACK SURGERY     BUNIONECTOMY     RIGHT FOOT   MAXIMUM ACCESS (MAS)POSTERIOR LUMBAR INTERBODY FUSION (PLIF) 3 LEVEL N/A 07/10/2016   Procedure: Lumbar two-Lumbar three - Lumbar three-Lumbar four - Lumbar four-Lumbar five MAXIMUM ACCESS (MAS) POSTERIOR LUMBAR INTERBODY FUSION;  Surgeon: Eustace Moore, MD;  Location: [place];  Service: Neurosurgery;  Laterality: N/A;   TONSILLECTOMY     TUBAL LIGATION      Family History  Problem Relation Age of Onset   Stroke Mother    Hypertension Mother    Ovarian cancer Mother    Hypercholesterolemia Father    Heart murmur Father    Heart disease Father    Hypertension Sister    Hypercholesterolemia Sister    Stroke Sister    Social History   Socioeconomic History   Marital status: Married    Spouse name: Not on file   Number of children: Not on file   Years of education: Not on file   Highest education level: Not on file  Occupational History   Not on file  Tobacco Use   Smoking status: Former    Packs/day: 1.00    Years: 25.00    Total pack years: 25.00    Types: Cigarettes    Quit date: 2018  Years since quitting: 6.0   Smokeless tobacco: Never  Vaping Use   Vaping Use: Every day  Substance and Sexual Activity   Alcohol use: Yes    Alcohol/week: 10.0 standard drinks of alcohol    Types: 10 Cans of beer per week    Comment: SOCIAL   Drug use: No   Sexual activity: Yes    Birth control/protection: Surgical  Other Topics Concern   Not on file  Social History Narrative   ** Merged History Encounter **       Social Determinants of Health   Financial Resource Strain: Not on file  Food Insecurity: Not on file  Transportation Needs: Not on file  Physical Activity: Not on file  Stress: Not on file  Social Connections: Not on file   CONSTITUTIONAL: Negative for chills, fatigue, fever, unintentional weight  gain and unintentional weight loss.  E/N/T: Negative for ear pain, nasal congestion and sore throat.  CARDIOVASCULAR: Negative for chest pain, dizziness, palpitations and pedal edema.  RESPIRATORY: Negative for recent cough and dyspnea.  GASTROINTESTINAL: Negative for abdominal pain, acid reflux symptoms, constipation, diarrhea, nausea and vomiting.  MSK: see HPI INTEGUMENTARY: Negative for rash.  NEUROLOGICAL: Negative for dizziness and headaches.  PSYCHIATRIC: Negative for sleep disturbance and to question depression screen.  Negative for depression, negative for anhedonia.       Objective:  PHYSICAL EXAM:   VS: BP 118/80 (BP Location: Right Arm, Patient Position: Sitting, Cuff Size: Large)   Pulse 74   Temp (!) 97.1 F (36.2 C) (Temporal)   Ht 5' 7.5" (1.715 m)   Wt 156 lb 9.6 oz (71 kg)   SpO2 100%   BMI 24.17 kg/m   GEN: Well nourished, well developed, in no acute distress  Cardiac: RRR; no murmurs, rubs, Respiratory:  normal respiratory rate and pattern with no distress - normal breath sounds with no rales, rhonchi, wheezes or rubs MS: no deformity or atrophy  Skin: warm and dry, no rash     Lab Results  Component Value Date   WBC 5.4 03/27/2021   HGB 15.1 03/27/2021   HCT 46.2 03/27/2021   PLT 245 03/27/2021   GLUCOSE 93 04/09/2021   CHOL 240 (H) 03/27/2021   TRIG 67 03/27/2021   HDL 88 03/27/2021   LDLCALC 141 (H) 03/27/2021   ALT 12 04/09/2021   AST 12 04/09/2021   NA 140 04/09/2021   K 4.1 04/09/2021   CL 100 04/09/2021   CREATININE 0.76 04/09/2021   BUN 14 04/09/2021   CO2 27 04/09/2021   TSH 1.180 03/27/2021   INR 0.9 01/13/2020      Assessment & Plan:   Problem List Items Addressed This Visit   None Visit Diagnoses     Benign hypertension    -  Primary   Relevant Medications   losartan (COZAAR) 25 MG tablet    Continue med Labwork pending   Anxiety       Relevant Medications   LORazepam (ATIVAN) 1 MG tablet Continue meds   Mixed  hyperlipidemia       Relevant Medications   losartan (COZAAR) 25 MG tablet    Watch diet     . Chronic low back pain Continue follow up with specialist as directed Continue flexeril as needed Continue with physical therapy  Need for flu vaccine Flucelvax given No orders of the defined types were placed in this encounter.   Orders Placed This Encounter  Procedures   Flu Vaccine MDCK QUAD  PF   CBC with Differential/Platelet   Comprehensive metabolic panel   Lipid panel      Follow-up: Return in about 6 months (around 07/29/2022) for fasting physical.  An After Visit Summary was printed and given to the patient.  Yetta Flock [facility] [phone]

## 2022-01-29 LAB — CBC WITH DIFFERENTIAL/PLATELET
Basophils Absolute: 0 10*3/uL (ref 0.0–0.2)
Basos: 1 %
EOS (ABSOLUTE): 0 10*3/uL (ref 0.0–0.4)
Eos: 1 %
Hematocrit: 40.5 % (ref 34.0–46.6)
Hemoglobin: 13.9 g/dL (ref 11.1–15.9)
Immature Grans (Abs): 0 10*3/uL (ref 0.0–0.1)
Immature Granulocytes: 0 %
Lymphocytes Absolute: 2.2 10*3/uL (ref 0.7–3.1)
Lymphs: 40 %
MCH: 31.9 pg (ref 26.6–33.0)
MCHC: 34.3 g/dL (ref 31.5–35.7)
MCV: 93 fL (ref 79–97)
Monocytes Absolute: 0.4 10*3/uL (ref 0.1–0.9)
Monocytes: 7 %
Neutrophils Absolute: 2.7 10*3/uL (ref 1.4–7.0)
Neutrophils: 51 %
Platelets: 254 10*3/uL (ref 150–450)
RBC: 4.36 x10E6/uL (ref 3.77–5.28)
RDW: 12.2 % (ref 11.7–15.4)
WBC: 5.4 10*3/uL (ref 3.4–10.8)

## 2022-01-29 LAB — COMPREHENSIVE METABOLIC PANEL
ALT: 17 IU/L (ref 0–32)
AST: 15 IU/L (ref 0–40)
Albumin/Globulin Ratio: 2 (ref 1.2–2.2)
Albumin: 4.6 g/dL (ref 3.8–4.9)
Alkaline Phosphatase: 54 IU/L (ref 44–121)
BUN/Creatinine Ratio: 18 (ref 9–23)
BUN: 15 mg/dL (ref 6–24)
Bilirubin Total: 0.4 mg/dL (ref 0.0–1.2)
CO2: 23 mmol/L (ref 20–29)
Calcium: 9.9 mg/dL (ref 8.7–10.2)
Chloride: 100 mmol/L (ref 96–106)
Creatinine, Ser: 0.84 mg/dL (ref 0.57–1.00)
Globulin, Total: 2.3 g/dL (ref 1.5–4.5)
Glucose: 86 mg/dL (ref 70–99)
Potassium: 4.7 mmol/L (ref 3.5–5.2)
Sodium: 140 mmol/L (ref 134–144)
Total Protein: 6.9 g/dL (ref 6.0–8.5)
eGFR: 83 mL/min/{1.73_m2} (ref >59)

## 2022-01-29 LAB — LIPID PANEL
Chol/HDL Ratio: 3.1 ratio (ref 0.0–4.4)
Cholesterol, Total: 258 mg/dL — ABNORMAL HIGH (ref 100–199)
HDL: 83 mg/dL (ref >39)
LDL Chol Calc (NIH): 167 mg/dL — ABNORMAL HIGH (ref 0–99)
Triglycerides: 54 mg/dL (ref 0–149)
VLDL Cholesterol Cal: 8 mg/dL (ref 5–40)

## 2022-01-29 LAB — CARDIOVASCULAR RISK ASSESSMENT

## 2022-01-30 ENCOUNTER — Telehealth: Payer: Self-pay

## 2022-01-30 ENCOUNTER — Other Ambulatory Visit: Payer: Self-pay | Admitting: Physician Assistant

## 2022-01-30 DIAGNOSIS — R531 Weakness: Secondary | ICD-10-CM | POA: Diagnosis not present

## 2022-01-30 DIAGNOSIS — E782 Mixed hyperlipidemia: Secondary | ICD-10-CM

## 2022-01-30 DIAGNOSIS — M545 Low back pain, unspecified: Secondary | ICD-10-CM | POA: Diagnosis not present

## 2022-01-30 DIAGNOSIS — M2560 Stiffness of unspecified joint, not elsewhere classified: Secondary | ICD-10-CM | POA: Diagnosis not present

## 2022-01-30 DIAGNOSIS — M961 Postlaminectomy syndrome, not elsewhere classified: Secondary | ICD-10-CM | POA: Diagnosis not present

## 2022-01-30 MED ORDER — ROSUVASTATIN CALCIUM 5 MG PO TABS: 5.0000 mg | ORAL_TABLET | Freq: Every day | ORAL | 2 refills | Status: DC

## 2022-01-30 NOTE — Telephone Encounter (Signed)
Patient called to get lab results and she has decided to start a statin.  Please advise to which one and I will call her to let her know,  Thank you

## 2022-02-01 ENCOUNTER — Other Ambulatory Visit: Payer: Self-pay

## 2022-02-01 MED ORDER — LORAZEPAM 1 MG PO TABS: 1.0000 mg | ORAL_TABLET | Freq: Every day | ORAL | 1 refills | Status: DC

## 2022-02-06 DIAGNOSIS — M2560 Stiffness of unspecified joint, not elsewhere classified: Secondary | ICD-10-CM | POA: Diagnosis not present

## 2022-02-06 DIAGNOSIS — R531 Weakness: Secondary | ICD-10-CM | POA: Diagnosis not present

## 2022-02-06 DIAGNOSIS — M545 Low back pain, unspecified: Secondary | ICD-10-CM | POA: Diagnosis not present

## 2022-02-06 DIAGNOSIS — M961 Postlaminectomy syndrome, not elsewhere classified: Secondary | ICD-10-CM | POA: Diagnosis not present

## 2022-02-08 DIAGNOSIS — R531 Weakness: Secondary | ICD-10-CM | POA: Diagnosis not present

## 2022-02-08 DIAGNOSIS — M961 Postlaminectomy syndrome, not elsewhere classified: Secondary | ICD-10-CM | POA: Diagnosis not present

## 2022-02-08 DIAGNOSIS — M545 Low back pain, unspecified: Secondary | ICD-10-CM | POA: Diagnosis not present

## 2022-02-08 DIAGNOSIS — M2560 Stiffness of unspecified joint, not elsewhere classified: Secondary | ICD-10-CM | POA: Diagnosis not present

## 2022-02-19 DIAGNOSIS — M545 Low back pain, unspecified: Secondary | ICD-10-CM | POA: Diagnosis not present

## 2022-02-19 DIAGNOSIS — R531 Weakness: Secondary | ICD-10-CM | POA: Diagnosis not present

## 2022-02-19 DIAGNOSIS — M961 Postlaminectomy syndrome, not elsewhere classified: Secondary | ICD-10-CM | POA: Diagnosis not present

## 2022-02-19 DIAGNOSIS — M2560 Stiffness of unspecified joint, not elsewhere classified: Secondary | ICD-10-CM | POA: Diagnosis not present

## 2022-03-06 ENCOUNTER — Other Ambulatory Visit: Payer: Self-pay

## 2022-04-12 ENCOUNTER — Ambulatory Visit (INDEPENDENT_AMBULATORY_CARE_PROVIDER_SITE_OTHER): Payer: BC Managed Care – PPO | Admitting: Physician Assistant

## 2022-04-12 ENCOUNTER — Encounter: Payer: Self-pay | Admitting: Physician Assistant

## 2022-04-12 VITALS — BP 110/74 | HR 74 | Temp 97.6°F | Resp 14 | Ht 67.5 in | Wt 160.0 lb

## 2022-04-12 DIAGNOSIS — L659 Nonscarring hair loss, unspecified: Secondary | ICD-10-CM

## 2022-04-12 DIAGNOSIS — E782 Mixed hyperlipidemia: Secondary | ICD-10-CM

## 2022-04-12 DIAGNOSIS — Z0000 Encounter for general adult medical examination without abnormal findings: Secondary | ICD-10-CM | POA: Diagnosis not present

## 2022-04-12 DIAGNOSIS — R82998 Other abnormal findings in urine: Secondary | ICD-10-CM

## 2022-04-12 LAB — POCT URINALYSIS DIP (CLINITEK)
Bilirubin, UA: NEGATIVE
Blood, UA: NEGATIVE
Glucose, UA: NEGATIVE mg/dL
Ketones, POC UA: NEGATIVE mg/dL
Nitrite, UA: NEGATIVE
POC PROTEIN,UA: NEGATIVE
Spec Grav, UA: 1.01 (ref 1.010–1.025)
Urobilinogen, UA: 0.2 E.U./dL
pH, UA: 5 (ref 5.0–8.0)

## 2022-04-12 MED ORDER — ROSUVASTATIN CALCIUM 5 MG PO TABS: 5.0000 mg | ORAL_TABLET | Freq: Every day | ORAL | 1 refills | Status: DC

## 2022-04-12 MED ORDER — CYCLOBENZAPRINE HCL 10 MG PO TABS: ORAL_TABLET | ORAL | 1 refills | Status: DC

## 2022-04-12 NOTE — Progress Notes (Signed)
Subjective:  Patient ID: Tammy [name], female    DOB: 1970/10/30  Age: 52 y.o. MRN: [id number]  Chief Complaint  Patient presents with   Annual Exam    HPI Well Adult Physical: Patient here for a comprehensive physical exam.The patient reports  that she has had some hair thinning and concerned about possible perimenopause 9she has had partial hysterectomy) - would like labwork Do you take any herbs or supplements that were not prescribed by a doctor? no Are you taking calcium supplements? no Are you taking aspirin daily? no  Encounter for general adult medical examination without abnormal findings  Physical ("At Risk" items are starred): Patient's last physical exam was 1 year ago .  Patient is not afflicted from Stress Incontinence and Urge Incontinence  Patient wears a seat belts Patient has smoke detectors and has carbon monoxide detectors. Patient practices appropriate gun safety. Patient wears sunscreen with extended sun exposure. Dental Care: biannual cleanings, brushes and flosses daily. Ophthalmology/Optometry: is due Hearing loss: none Vision impairments: glasses (readers)   Safe at home: yes Self breast exams: yes Due to schedule mammogram     01/28/2022   10:05 AM 04/09/2021    2:10 PM  Depression screen PHQ 2/9  Decreased Interest 0 0  Down, Depressed, Hopeless 0 0  PHQ - 2 Score 0 0  Altered sleeping 0   Tired, decreased energy 0   Change in appetite 0   Feeling bad or failure about yourself  0   Trouble concentrating 0   Moving slowly or fidgety/restless 0   Suicidal thoughts 0   PHQ-9 Score 0   Difficult doing work/chores Not difficult at all          01/17/2020    6:09 AM 01/17/2020    1:48 PM 01/17/2020    9:00 PM 01/28/2022   10:05 AM  Fall Risk  Falls in the past year?    1  Was there an injury with Fall?    0  Fall Risk Category Calculator    1  Fall Risk Category (Retired)    Low  (RETIRED) Patient Fall Risk Level Low fall risk  Moderate fall risk Moderate fall risk Low fall risk  Patient at Risk for Falls Due to    History of fall(s)  Fall risk Follow up    Falls evaluation completed             Social Hx   Social History   Socioeconomic History   Marital status: Married    Spouse name: Not on file   Number of children: Not on file   Years of education: Not on file   Highest education level: Not on file  Occupational History   Not on file  Tobacco Use   Smoking status: Former    Packs/day: 1.00    Years: 25.00    Additional pack years: 0.00    Total pack years: 25.00    Types: Cigarettes    Quit date: 2018    Years since quitting: 6.2   Smokeless tobacco: Never  Vaping Use   Vaping Use: Every day  Substance and Sexual Activity   Alcohol use: Yes    Alcohol/week: 10.0 standard drinks of alcohol    Types: 10 Cans of beer per week    Comment: SOCIAL   Drug use: No   Sexual activity: Yes    Birth control/protection: Surgical  Other Topics Concern   Not on file  Social History Narrative   **  Merged History Encounter **       Social Determinants of Health   Financial Resource Strain: Not on file  Food Insecurity: Not on file  Transportation Needs: Not on file  Physical Activity: Not on file  Stress: Not on file  Social Connections: Not on file   Past Medical History:  Diagnosis Date   Acute nonintractable headache 01/21/2021   Arthritis    Back pain    Close exposure to COVID-19 virus 01/21/2021   Degeneration of lumbar intervertebral disc 02/27/2016   Displacement of lumbar intervertebral disc without myelopathy 02/27/2016   Idiopathic scoliosis 02/27/2016   Low back pain 02/27/2016   Nail fungus    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   S/P lumbar fusion 01/17/2020   S/P lumbar spinal fusion 07/10/2016   Past Surgical History:  Procedure Laterality Date   ABDOMINAL HYSTERECTOMY     BACK SURGERY     BUNIONECTOMY     RIGHT FOOT   MAXIMUM ACCESS (MAS)POSTERIOR LUMBAR INTERBODY FUSION (PLIF) 3  LEVEL N/A 07/10/2016   Procedure: Lumbar two-Lumbar three - Lumbar three-Lumbar four - Lumbar four-Lumbar five MAXIMUM ACCESS (MAS) POSTERIOR LUMBAR INTERBODY FUSION;  Surgeon: Eustace Moore, MD;  Location: [facility];  Service: Neurosurgery;  Laterality: N/A;   TONSILLECTOMY     TUBAL LIGATION      Family History  Problem Relation Age of Onset   Stroke Mother    Hypertension Mother    Ovarian cancer Mother    Hypercholesterolemia Father    Heart murmur Father    Heart disease Father    Hypertension Sister    Hypercholesterolemia Sister    Stroke Sister     Review of Systems CONSTITUTIONAL: see HPI E/N/T: Negative for ear pain, nasal congestion and sore throat.  CARDIOVASCULAR: Negative for chest pain, dizziness, palpitations and pedal edema.  RESPIRATORY: Negative for recent cough and dyspnea.  GASTROINTESTINAL: Negative for abdominal pain, acid reflux symptoms, constipation, diarrhea, nausea and vomiting.  MSK: Negative for arthralgias and myalgias.  INTEGUMENTARY: Negative for rash.  NEUROLOGICAL: Negative for dizziness and headaches.  PSYCHIATRIC: Negative for sleep disturbance and to question depression screen.  Negative for depression, negative for anhedonia.       Objective:  PHYSICAL EXAM:   VS: BP 110/74   Pulse 74   Temp 97.6 F (36.4 C)   Resp 14   Ht 5' 7.5" (1.715 m)   Wt 160 lb (72.6 kg)   SpO2 100%   BMI 24.69 kg/m   GEN: Well nourished, well developed, in no acute distress  HEENT: normal external ears and nose - normal external auditory canals and TMS - hearing grossly normal - - Lips, Teeth and Gums - normal  Oropharynx - normal mucosa, palate, and posterior pharynx Neck: no JVD or masses - no thyromegaly Cardiac: RRR; no murmurs, rubs, or gallops,no edema - no significant varicosities Respiratory:  normal respiratory rate and pattern with no distress - normal breath sounds with no rales, rhonchi, wheezes or rubs GI: normal bowel sounds, no masses or  tenderness MS: no deformity or atrophy  Skin: warm and dry, no rash  Neuro:  Alert and Oriented x 3, Strength and sensation are intact - CN II-Xii grossly intact Psych: euthymic mood, appropriate affect and demeanor   Office Visit on 04/12/2022  Component Date Value Ref Range Status   Color, UA 04/12/2022 yellow  yellow Final   Clarity, UA 04/12/2022 clear  clear Final   Glucose, UA 04/12/2022 negative  negative mg/dL Final  Bilirubin, UA 04/12/2022 negative  negative Final   Ketones, POC UA 04/12/2022 negative  negative mg/dL Final   Spec Grav, UA 04/12/2022 1.010  1.010 - 1.025 Final   Blood, UA 04/12/2022 negative  negative Final   pH, UA 04/12/2022 5.0  5.0 - 8.0 Final   POC PROTEIN,UA 04/12/2022 negative  negative, trace Final   Urobilinogen, UA 04/12/2022 0.2  0.2 or 1.0 E.U./dL Final   Nitrite, UA 04/12/2022 Negative  Negative Final   Leukocytes, UA 04/12/2022 Small (1+) (A)  Negative Final    Lab Results  Component Value Date   WBC 5.4 01/28/2022   HGB 13.9 01/28/2022   HCT 40.5 01/28/2022   PLT 254 01/28/2022   GLUCOSE 86 01/28/2022   CHOL 258 (H) 01/28/2022   TRIG 54 01/28/2022   HDL 83 01/28/2022   LDLCALC 167 (H) 01/28/2022   ALT 17 01/28/2022   AST 15 01/28/2022   NA 140 01/28/2022   K 4.7 01/28/2022   CL 100 01/28/2022   CREATININE 0.84 01/28/2022   BUN 15 01/28/2022   CO2 23 01/28/2022   TSH 1.180 03/27/2021   INR 0.9 01/13/2020      Assessment & Plan:  Routine physical examination -     POCT URINALYSIS DIP (CLINITEK) -     CBC with Differential/Platelet -     Comprehensive metabolic panel -     TSH -     Lipid panel -     VITAMIN D 25 Hydroxy (Vit-D Deficiency, Fractures) -     Iron, TIBC and Ferritin Panel -     FSH/LH -     Digital Screening Mammogram, Left and Right; Future  Hair thinning -     VITAMIN D 25 Hydroxy (Vit-D Deficiency, Fractures) -     Iron, TIBC and Ferritin Panel -     FSH/LH  Leukocytes in urine -     Urine  Culture  Mixed hyperlipidemia -     Rosuvastatin Calcium; Take 1 tablet (5 mg total) by mouth daily.  Dispense: 90 tablet; Refill: 1  Other orders -     Cyclobenzaprine HCl; TAKE ONE TABLET BY MOUTH 3 TIMES DAILY FOR MUSCLE SPASMS  Dispense: 90 tablet; Refill: 1     This is a list of the screening recommended for you and due dates:  Health Maintenance  Topic Date Due   DTaP/Tdap/Td vaccine (1 - Tdap) Never done   Zoster (Shingles) Vaccine (1 of 2) 04/29/2022*   Screening for Lung Cancer  10/22/2022*   Mammogram  06/07/2022   Colon Cancer Screening  02/04/2029   Flu Shot  Completed   HPV Vaccine  Aged Out   COVID-19 Vaccine  Discontinued   Hepatitis C Screening: USPSTF Recommendation to screen - Ages 18-79 yo.  Discontinued   HIV Screening  Discontinued  *Topic was postponed. The date shown is not the original due date.     Meds ordered this encounter  Medications   cyclobenzaprine (FLEXERIL) 10 MG tablet    Sig: TAKE ONE TABLET BY MOUTH 3 TIMES DAILY FOR MUSCLE SPASMS    Dispense:  90 tablet    Refill:  1    Order Specific Question:   Supervising Provider    Answer:   Shelton Silvas   rosuvastatin (CRESTOR) 5 MG tablet    Sig: Take 1 tablet (5 mg total) by mouth daily.    Dispense:  90 tablet    Refill:  1    Order Specific  Question:   Supervising Provider    AnswerRochel Brome IO:[id number]    Follow-up: Return in about 6 months (around 10/13/2022) for chronic fasting follow up.  An After Visit Summary was printed and given to the patient.  Yetta Flock [facility] [phone]

## 2022-04-13 LAB — CBC WITH DIFFERENTIAL/PLATELET
Basophils Absolute: 0 10*3/uL (ref 0.0–0.2)
Basos: 1 %
EOS (ABSOLUTE): 0.1 10*3/uL (ref 0.0–0.4)
Eos: 1 %
Hematocrit: 40 % (ref 34.0–46.6)
Hemoglobin: 13.2 g/dL (ref 11.1–15.9)
Immature Grans (Abs): 0 10*3/uL (ref 0.0–0.1)
Immature Granulocytes: 0 %
Lymphocytes Absolute: 2.3 10*3/uL (ref 0.7–3.1)
Lymphs: 50 %
MCH: 30 pg (ref 26.6–33.0)
MCHC: 33 g/dL (ref 31.5–35.7)
MCV: 91 fL (ref 79–97)
Monocytes Absolute: 0.4 10*3/uL (ref 0.1–0.9)
Monocytes: 10 %
Neutrophils Absolute: 1.7 10*3/uL (ref 1.4–7.0)
Neutrophils: 38 %
Platelets: 277 10*3/uL (ref 150–450)
RBC: 4.4 x10E6/uL (ref 3.77–5.28)
RDW: 12.1 % (ref 11.7–15.4)
WBC: 4.5 10*3/uL (ref 3.4–10.8)

## 2022-04-13 LAB — COMPREHENSIVE METABOLIC PANEL
ALT: 29 IU/L (ref 0–32)
AST: 18 IU/L (ref 0–40)
Albumin/Globulin Ratio: 1.8 (ref 1.2–2.2)
Albumin: 4.5 g/dL (ref 3.8–4.9)
Alkaline Phosphatase: 55 IU/L (ref 44–121)
BUN/Creatinine Ratio: 22 (ref 9–23)
BUN: 20 mg/dL (ref 6–24)
Bilirubin Total: 0.5 mg/dL (ref 0.0–1.2)
CO2: 24 mmol/L (ref 20–29)
Calcium: 9.8 mg/dL (ref 8.7–10.2)
Chloride: 101 mmol/L (ref 96–106)
Creatinine, Ser: 0.91 mg/dL (ref 0.57–1.00)
Globulin, Total: 2.5 g/dL (ref 1.5–4.5)
Glucose: 73 mg/dL (ref 70–99)
Potassium: 4.5 mmol/L (ref 3.5–5.2)
Sodium: 140 mmol/L (ref 134–144)
Total Protein: 7 g/dL (ref 6.0–8.5)
eGFR: 75 mL/min/{1.73_m2} (ref >59)

## 2022-04-13 LAB — IRON,TIBC AND FERRITIN PANEL
Ferritin: 206 ng/mL — ABNORMAL HIGH (ref 15–150)
Iron Saturation: 36 % (ref 15–55)
Iron: 103 ug/dL (ref 27–159)
Total Iron Binding Capacity: 290 ug/dL (ref 250–450)
UIBC: 187 ug/dL (ref 131–425)

## 2022-04-13 LAB — VITAMIN D 25 HYDROXY (VIT D DEFICIENCY, FRACTURES): Vit D, 25-Hydroxy: 34 ng/mL (ref 30.0–100.0)

## 2022-04-13 LAB — LIPID PANEL
Chol/HDL Ratio: 2.7 ratio (ref 0.0–4.4)
Cholesterol, Total: 177 mg/dL (ref 100–199)
HDL: 65 mg/dL (ref >39)
LDL Chol Calc (NIH): 97 mg/dL (ref 0–99)
Triglycerides: 81 mg/dL (ref 0–149)
VLDL Cholesterol Cal: 15 mg/dL (ref 5–40)

## 2022-04-13 LAB — TSH: TSH: 0.988 u[IU]/mL (ref 0.450–4.500)

## 2022-04-13 LAB — FSH/LH
FSH: 126 m[IU]/mL
LH: 50.1 m[IU]/mL

## 2022-04-13 LAB — CARDIOVASCULAR RISK ASSESSMENT

## 2022-04-16 ENCOUNTER — Other Ambulatory Visit: Payer: Self-pay | Admitting: Physician Assistant

## 2022-04-16 LAB — URINE CULTURE

## 2022-04-16 MED ORDER — AMOXICILLIN 875 MG PO TABS: 875.0000 mg | ORAL_TABLET | Freq: Two times a day (BID) | ORAL | 0 refills | Status: AC

## 2022-04-17 ENCOUNTER — Other Ambulatory Visit: Payer: Self-pay

## 2022-04-24 DIAGNOSIS — R921 Mammographic calcification found on diagnostic imaging of breast: Secondary | ICD-10-CM | POA: Diagnosis not present

## 2022-04-24 DIAGNOSIS — R928 Other abnormal and inconclusive findings on diagnostic imaging of breast: Secondary | ICD-10-CM | POA: Diagnosis not present

## 2022-04-25 ENCOUNTER — Other Ambulatory Visit: Payer: Self-pay

## 2022-04-25 DIAGNOSIS — Z0000 Encounter for general adult medical examination without abnormal findings: Secondary | ICD-10-CM

## 2022-04-30 ENCOUNTER — Ambulatory Visit (INDEPENDENT_AMBULATORY_CARE_PROVIDER_SITE_OTHER): Payer: BC Managed Care – PPO

## 2022-04-30 DIAGNOSIS — R82998 Other abnormal findings in urine: Secondary | ICD-10-CM

## 2022-04-30 LAB — POCT URINALYSIS DIP (CLINITEK)
Bilirubin, UA: NEGATIVE
Blood, UA: NEGATIVE
Glucose, UA: NEGATIVE mg/dL
Ketones, POC UA: NEGATIVE mg/dL
Leukocytes, UA: NEGATIVE
Nitrite, UA: NEGATIVE
POC PROTEIN,UA: NEGATIVE
Spec Grav, UA: 1.025 (ref 1.010–1.025)
Urobilinogen, UA: 0.2 E.U./dL
pH, UA: 5 (ref 5.0–8.0)

## 2022-05-04 ENCOUNTER — Other Ambulatory Visit: Payer: Self-pay | Admitting: Physician Assistant

## 2022-05-21 ENCOUNTER — Other Ambulatory Visit: Payer: Self-pay | Admitting: Physician Assistant

## 2022-06-20 ENCOUNTER — Telehealth: Payer: Self-pay | Admitting: Physician Assistant

## 2022-06-20 NOTE — Telephone Encounter (Signed)
PT CALLED WANTING TO MAKE AN APPT WITH PCP FIRST AVAILED. PT STATED SHE PULLED A MUSCLE IN HER BACK. I ASKED IF IT HAPPENED AT WORK AND PT SAID YES. I TOLD HER WE DONT DO 3RD PARTY BILLING TO SEEK OUT HER HR DEPARTMENT DUE TO IT HAPPENING AT WORK

## 2022-07-09 ENCOUNTER — Other Ambulatory Visit: Payer: Self-pay | Admitting: Physician Assistant

## 2022-08-01 DIAGNOSIS — M5451 Vertebrogenic low back pain: Secondary | ICD-10-CM | POA: Insufficient documentation

## 2022-09-09 ENCOUNTER — Other Ambulatory Visit: Payer: Self-pay | Admitting: Physician Assistant

## 2022-10-09 ENCOUNTER — Other Ambulatory Visit: Payer: Self-pay | Admitting: Physician Assistant

## 2022-10-09 ENCOUNTER — Other Ambulatory Visit: Payer: Self-pay | Admitting: Specialist

## 2022-10-09 DIAGNOSIS — M51369 Other intervertebral disc degeneration, lumbar region without mention of lumbar back pain or lower extremity pain: Secondary | ICD-10-CM

## 2022-10-09 DIAGNOSIS — M5459 Other low back pain: Secondary | ICD-10-CM

## 2022-10-09 DIAGNOSIS — M5136 Other intervertebral disc degeneration, lumbar region: Secondary | ICD-10-CM

## 2022-10-14 ENCOUNTER — Ambulatory Visit
Admission: RE | Admit: 2022-10-14 | Discharge: 2022-10-14 | Disposition: A | Discharge status: Home / Self Care | Payer: Worker's Compensation | Source: Ambulatory Visit | Attending: Specialist | Admitting: Specialist

## 2022-10-14 DIAGNOSIS — M5459 Other low back pain: Secondary | ICD-10-CM

## 2022-10-14 DIAGNOSIS — M5136 Other intervertebral disc degeneration, lumbar region: Secondary | ICD-10-CM

## 2022-10-23 ENCOUNTER — Ambulatory Visit: Payer: BC Managed Care – PPO | Admitting: Physician Assistant

## 2022-11-04 ENCOUNTER — Other Ambulatory Visit: Payer: Self-pay

## 2022-11-04 MED ORDER — LOSARTAN POTASSIUM 25 MG PO TABS: 25.0000 mg | ORAL_TABLET | Freq: Every day | ORAL | 0 refills | Status: DC

## 2022-11-11 ENCOUNTER — Other Ambulatory Visit: Payer: Self-pay | Admitting: Physician Assistant

## 2022-11-12 DIAGNOSIS — M533 Sacrococcygeal disorders, not elsewhere classified: Secondary | ICD-10-CM | POA: Insufficient documentation

## 2022-11-14 ENCOUNTER — Other Ambulatory Visit: Payer: Self-pay

## 2022-11-14 MED ORDER — LOSARTAN POTASSIUM 25 MG PO TABS: 25.0000 mg | ORAL_TABLET | Freq: Every day | ORAL | 0 refills | Status: DC

## 2022-11-20 ENCOUNTER — Encounter: Payer: Self-pay | Admitting: Physician Assistant

## 2022-11-20 ENCOUNTER — Ambulatory Visit (INDEPENDENT_AMBULATORY_CARE_PROVIDER_SITE_OTHER): Payer: BC Managed Care – PPO | Admitting: Physician Assistant

## 2022-11-20 VITALS — BP 102/68 | HR 65 | Temp 97.8°F | Resp 16 | Ht 67.5 in | Wt 158.2 lb

## 2022-11-20 DIAGNOSIS — G8929 Other chronic pain: Secondary | ICD-10-CM

## 2022-11-20 DIAGNOSIS — I10 Essential (primary) hypertension: Secondary | ICD-10-CM | POA: Diagnosis not present

## 2022-11-20 DIAGNOSIS — R899 Unspecified abnormal finding in specimens from other organs, systems and tissues: Secondary | ICD-10-CM

## 2022-11-20 DIAGNOSIS — J060 Acute laryngopharyngitis: Secondary | ICD-10-CM | POA: Diagnosis not present

## 2022-11-20 DIAGNOSIS — E782 Mixed hyperlipidemia: Secondary | ICD-10-CM

## 2022-11-20 DIAGNOSIS — M545 Low back pain, unspecified: Secondary | ICD-10-CM

## 2022-11-20 MED ORDER — CYCLOBENZAPRINE HCL 10 MG PO TABS: ORAL_TABLET | ORAL | 1 refills | Status: DC

## 2022-11-20 MED ORDER — AZITHROMYCIN 250 MG PO TABS
ORAL_TABLET | ORAL | 0 refills | Status: AC
Start: 2022-11-20 — End: 2022-11-25

## 2022-11-20 NOTE — Progress Notes (Signed)
Subjective:  Patient ID: Tammy [name], female    DOB: Aug 12, 1970  Age: 52 y.o. MRN: [id number]  Chief Complaint  Patient presents with   Medical Management of Chronic Issues      Pt presents for follow up of hypertension. The patient is tolerating the medication well without side effects. Compliance with treatment has been good; including taking medication as directed , maintains a healthy diet and regular exercise regimen , and following up as directed. She is currently on losartan 25mg  -   Pt with history of elevated lipids - she is currently taking crestor 5mg  qd - due for labwork  Pt with history of anxiety - stable on ativan which she uses as needed  Pt with history of chronic low back pain - she does follow with neurosurgeon.  She states that her last surgery did not end up fusing her [name] vertebrae after all -she is on meloxicam and flexeril She also has not been working since May - had an accident at work and following with Dr Shelle Iron at [facility] in [place] at this time - has started gabapentin 300mg  at bedtime also for back and hip pain  Last visit ferritin was elevated - due to repeat  Pt complains of sinus pressure and pain for the past few days - had low grade temp a few days ago Nonproductive cough  Current Outpatient Medications on File Prior to Visit  Medication Sig Dispense Refill   gabapentin (NEURONTIN) 300 MG capsule Take 300 mg by mouth at bedtime.     LORazepam (ATIVAN) 1 MG tablet TAKE ONE TABLET BY MOUTH AT BEDTIME 30 tablet 0   losartan (COZAAR) 25 MG tablet Take 1 tablet (25 mg total) by mouth daily. 90 tablet 0   meloxicam (MOBIC) 7.5 MG tablet Take 7.5 mg by mouth daily.     rosuvastatin (CRESTOR) 5 MG tablet Take 1 tablet (5 mg total) by mouth daily. 90 tablet 1   No current facility-administered medications on file prior to visit.   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   S/P lumbar fusion 01/17/2020   S/P lumbar spinal fusion 07/10/2016   Past Surgical History:  Procedure Laterality Date   ABDOMINAL HYSTERECTOMY     BACK SURGERY     BUNIONECTOMY     RIGHT FOOT   MAXIMUM ACCESS (MAS)POSTERIOR LUMBAR INTERBODY FUSION (PLIF) 3 LEVEL N/A 07/10/2016   Procedure: Lumbar two-Lumbar three - Lumbar three-Lumbar four - Lumbar four-Lumbar five MAXIMUM ACCESS (MAS) POSTERIOR LUMBAR INTERBODY FUSION;  Surgeon: Tia Alert, MD;  Location: [facility];  Service: Neurosurgery;  Laterality: N/A;   TONSILLECTOMY     TUBAL LIGATION      Family History  Problem Relation Age of Onset   Stroke Mother    Hypertension Mother    Ovarian cancer Mother    Hypercholesterolemia Father    Heart murmur Father    Heart disease Father    Hypertension Sister    Hypercholesterolemia Sister    Stroke Sister    Social History   Socioeconomic History   Marital status: Married    Spouse name: Not on  file   Number of children: Not on file   Years of education: Not on file   Highest education level: Not on file  Occupational History   Not on file  Tobacco Use   Smoking status: Former    Current packs/day: 0.00    Average packs/day: 1 pack/day for 25.0 years (25.0 ttl pk-yrs)    Types: Cigarettes    Start date: 8    Quit date: 2018    Years since quitting: 6.8   Smokeless tobacco: Never  Vaping Use   Vaping status: Every Day  Substance and Sexual Activity   Alcohol use: Yes    Alcohol/week: 10.0 standard drinks of alcohol    Types: 10 Cans of beer per week    Comment: SOCIAL   Drug use: No   Sexual activity: Yes    Birth control/protection: Surgical  Other Topics Concern   Not on file   Social History Narrative   ** Merged History Encounter **       Social Determinants of Health   Financial Resource Strain: Not on file  Food Insecurity: Not on file  Transportation Needs: Not on file  Physical Activity: Not on file  Stress: Not on file  Social Connections: Not on file   CONSTITUTIONAL: Negative for chills, fatigue, fever, unintentional weight gain and unintentional weight loss.  E/N/T: see HPI CARDIOVASCULAR: Negative for chest pain, dizziness, palpitations and pedal edema.  RESPIRATORY: Negative for recent cough and dyspnea.  GASTROINTESTINAL: Negative for abdominal pain, acid reflux symptoms, constipation, diarrhea, nausea and vomiting.  MSK: see HPI INTEGUMENTARY: Negative for rash.   PSYCHIATRIC: Negative for sleep disturbance and to question depression screen.  Negative for depression, negative for anhedonia.        Objective:  PHYSICAL EXAM:   VS: BP 102/68 (BP Location: Left Arm, Patient Position: Sitting, Cuff Size: Large)   Pulse 65   Temp 97.8 F (36.6 C) (Temporal)   Resp 16   Ht 5' 7.5" (1.715 m)   Wt 158 lb 3.2 oz (71.8 kg)   SpO2 98%   BMI 24.41 kg/m   GEN: Well nourished, well developed, in no acute distress  HEENT: normal external ears and nose - normal external auditory canals and TMS -  - Lips, Teeth and Gums - normal  Oropharynx - erythema/pnd  Cardiac: RRR; no murmurs, rubs, or gallops,no edema -  Respiratory:  normal respiratory rate and pattern with no distress - normal breath sounds with no rales, rhonchi, wheezes or rubs  MS: no deformity or atrophy  Skin: warm and dry, no rash   Psych: euthymic mood, appropriate affect and demeanor   Lab Results  Component Value Date   WBC 4.5 04/12/2022   HGB 13.2 04/12/2022   HCT 40.0 04/12/2022   PLT 277 04/12/2022   GLUCOSE 73 04/12/2022   CHOL 177 04/12/2022   TRIG 81 04/12/2022   HDL 65 04/12/2022   LDLCALC 97 04/12/2022   ALT 29 04/12/2022   AST 18 04/12/2022   NA 140  04/12/2022   K 4.5 04/12/2022   CL 101 04/12/2022   CREATININE 0.91 04/12/2022   BUN 20 04/12/2022   CO2 24 04/12/2022   TSH 0.988 04/12/2022   INR 0.9 01/13/2020      Assessment & Plan:   Problem List Items Addressed This Visit   None Visit Diagnoses     Benign hypertension    -  Primary   Relevant Medications   losartan (COZAAR) 25 MG  tablet    Continue med Labwork pending   Anxiety       Relevant Medications   LORazepam (ATIVAN) 1 MG tablet Continue meds   Mixed hyperlipidemia       Relevant Medications   losartan (COZAAR) 25 MG tablet   Crestor 5mg  qd Watch diet     . Chronic low back pain Continue follow up with specialist as directed Continue flexeril as needed  URI Rx for zpack as directed  Elevated ferritin/abnormal labs Iron studies pending Meds ordered this encounter  Medications   azithromycin (ZITHROMAX) 250 MG tablet    Sig: Take 2 tablets on day 1, then 1 tablet daily on days 2 through 5    Dispense:  6 tablet    Refill:  0    Order Specific Question:   Supervising Provider    Answer:   [facility], Aniceto Boss   cyclobenzaprine (FLEXERIL) 10 MG tablet    Sig: TAKE ONE TABLET BY MOUTH 3 TIMES DAILY FOR MUSCLE SPASMS    Dispense:  90 tablet    Refill:  1    Order Specific Question:   Supervising Provider    AnswerCorey Harold    Orders Placed This Encounter  Procedures   CBC with Differential/Platelet   Comprehensive metabolic panel   Lipid panel   Iron, TIBC and Ferritin Panel      Follow-up: Return in about 6 months (around 05/21/2023) for chronic fasting follow-up.  An After Visit Summary was printed and given to the patient.  Jettie Pagan [facility] [phone]

## 2022-11-21 ENCOUNTER — Other Ambulatory Visit: Payer: Self-pay | Admitting: Physician Assistant

## 2022-11-21 DIAGNOSIS — R7989 Other specified abnormal findings of blood chemistry: Secondary | ICD-10-CM

## 2022-11-21 DIAGNOSIS — R899 Unspecified abnormal finding in specimens from other organs, systems and tissues: Secondary | ICD-10-CM

## 2022-11-21 LAB — COMPREHENSIVE METABOLIC PANEL
ALT: 18 [IU]/L (ref 0–32)
AST: 23 [IU]/L (ref 0–40)
Albumin: 4.4 g/dL (ref 3.8–4.9)
Alkaline Phosphatase: 63 [IU]/L (ref 44–121)
BUN/Creatinine Ratio: 16 (ref 9–23)
BUN: 14 mg/dL (ref 6–24)
Bilirubin Total: 0.2 mg/dL (ref 0.0–1.2)
CO2: 24 mmol/L (ref 20–29)
Calcium: 9.1 mg/dL (ref 8.7–10.2)
Chloride: 103 mmol/L (ref 96–106)
Creatinine, Ser: 0.86 mg/dL (ref 0.57–1.00)
Globulin, Total: 2.2 g/dL (ref 1.5–4.5)
Glucose: 84 mg/dL (ref 70–99)
Potassium: 4.4 mmol/L (ref 3.5–5.2)
Sodium: 139 mmol/L (ref 134–144)
Total Protein: 6.6 g/dL (ref 6.0–8.5)
eGFR: 80 mL/min/{1.73_m2} (ref >59)

## 2022-11-21 LAB — IRON,TIBC AND FERRITIN PANEL
Ferritin: 232 ng/mL — ABNORMAL HIGH (ref 15–150)
Iron Saturation: 9 % — CL (ref 15–55)
Iron: 26 ug/dL — ABNORMAL LOW (ref 27–159)
Total Iron Binding Capacity: 288 ug/dL (ref 250–450)
UIBC: 262 ug/dL (ref 131–425)

## 2022-11-21 LAB — CBC WITH DIFFERENTIAL/PLATELET
Basophils Absolute: 0 10*3/uL (ref 0.0–0.2)
Basos: 1 %
EOS (ABSOLUTE): 0.1 10*3/uL (ref 0.0–0.4)
Eos: 1 %
Hematocrit: 39.9 % (ref 34.0–46.6)
Hemoglobin: 12.9 g/dL (ref 11.1–15.9)
Immature Grans (Abs): 0 10*3/uL (ref 0.0–0.1)
Immature Granulocytes: 0 %
Lymphocytes Absolute: 1.9 10*3/uL (ref 0.7–3.1)
Lymphs: 30 %
MCH: 31.5 pg (ref 26.6–33.0)
MCHC: 32.3 g/dL (ref 31.5–35.7)
MCV: 97 fL (ref 79–97)
Monocytes Absolute: 0.7 10*3/uL (ref 0.1–0.9)
Monocytes: 10 %
Neutrophils Absolute: 3.7 10*3/uL (ref 1.4–7.0)
Neutrophils: 58 %
Platelets: 231 10*3/uL (ref 150–450)
RBC: 4.1 x10E6/uL (ref 3.77–5.28)
RDW: 12.3 % (ref 11.7–15.4)
WBC: 6.4 10*3/uL (ref 3.4–10.8)

## 2022-11-21 LAB — LIPID PANEL
Chol/HDL Ratio: 2.2 ratio (ref 0.0–4.4)
Cholesterol, Total: 172 mg/dL (ref 100–199)
HDL: 77 mg/dL (ref >39)
LDL Chol Calc (NIH): 79 mg/dL (ref 0–99)
Triglycerides: 87 mg/dL (ref 0–149)
VLDL Cholesterol Cal: 16 mg/dL (ref 5–40)

## 2022-12-03 ENCOUNTER — Inpatient Hospital Stay: Payer: BC Managed Care – PPO | Attending: Oncology | Admitting: Oncology

## 2022-12-03 ENCOUNTER — Other Ambulatory Visit: Payer: BC Managed Care – PPO

## 2022-12-03 VITALS — BP 128/86 | HR 73 | Resp 16 | Ht 67.5 in | Wt 158.0 lb

## 2022-12-03 DIAGNOSIS — R7989 Other specified abnormal findings of blood chemistry: Secondary | ICD-10-CM | POA: Insufficient documentation

## 2022-12-03 DIAGNOSIS — R233 Spontaneous ecchymoses: Secondary | ICD-10-CM | POA: Insufficient documentation

## 2022-12-03 NOTE — Progress Notes (Signed)
[facility] Initial Visit:  Patient Care Team: Marianne Sofia, PA-C as PCP - General (Physician Assistant) Cox, Kirsten, MD (Family Medicine)  CHIEF COMPLAINTS/PURPOSE OF CONSULTATION: HISTORY OF PRESENTING ILLNESS: Tammy [name] 52 y.o. female is here because of elevated ferritin Medical history notable for intractable headaches, arthritis, back pain, spinal fusions  April 12, 2022: Ferritin 206 iron saturation 36 hemoglobin 13.2.  CMP normal  November 20, 2022: WBC 6.4 hemoglobin 12.9 MCV 97 platelet count 231; 58 segs 30 lymphs 10 monos 1 EO Ferritin 232 iron saturation 9% CMP normal  December 03, 2022:  [facility] Hematology Consult  Takes calcium but no other supplements.  No history of blood transfusion despite undergoing several back surgeries.  No history of postpartum hemorrhage.  No epistaxis or gingival bleeding.  Underwent hysterectomy in 2001 due to dysfunctional uterine bleeding.  For the last several years has experienced easy bruising on her arms.  Does not take ASA but take Mobic for pain.  Does not use Goody powders and only seldomly uses Ibuprofen.    Social history: Married.  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  Smoked 1 pack/day of cigarettes for 25 years quit 2018.  Has 10 drinks of alcohol per week  [facility] Mother alive 25 degenerative disk disease, HTN, hypercholesterolemia, ovarian cancer Father alive 46 CAD, HTN, AVR Sister alive 15 heavy drinker and CVA, hypercholesterolemia  Review of Systems - Oncology  MEDICAL HISTORY: Past Medical History:  Diagnosis Date   Acute nonintractable headache 01/21/2021   Arthritis    Back pain    Close exposure to COVID-19 virus 01/21/2021   Degeneration of lumbar intervertebral disc 02/27/2016   Displacement of lumbar intervertebral disc without myelopathy 02/27/2016   Idiopathic scoliosis 02/27/2016   Low back pain 02/27/2016   Nail fungus    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   S/P lumbar fusion 01/17/2020   S/P lumbar  spinal fusion 07/10/2016    SURGICAL HISTORY: Past Surgical History:  Procedure Laterality Date   ABDOMINAL HYSTERECTOMY     BACK SURGERY     BUNIONECTOMY     RIGHT FOOT   MAXIMUM ACCESS (MAS)POSTERIOR LUMBAR INTERBODY FUSION (PLIF) 3 LEVEL N/A 07/10/2016   Procedure: Lumbar two-Lumbar three - Lumbar three-Lumbar four - Lumbar four-Lumbar five MAXIMUM ACCESS (MAS) POSTERIOR LUMBAR INTERBODY FUSION;  Surgeon: Tia Alert, MD;  [facility] OR;  Service: Neurosurgery;  Laterality: N/A;   TONSILLECTOMY     TUBAL LIGATION      SOCIAL HISTORY: Social History   Socioeconomic History   Marital status: Married    Spouse name: Not on file   Number of children: Not on file   Years of education: Not on file   Highest education level: Not on file  Occupational History   Not on file  Tobacco Use   Smoking status: Former    Current packs/day: 0.00    Average packs/day: 1 pack/day for 25.0 years (25.0 ttl pk-yrs)    Types: Cigarettes    Start date: 66    Quit date: 2018    Years since quitting: 6.8   Smokeless tobacco: Never  Vaping Use   Vaping status: Every Day  Substance and Sexual Activity   Alcohol use: Yes    Alcohol/week: 10.0 standard drinks of alcohol    Types: 10 Cans of beer per week    Comment: SOCIAL   Drug use: No   Sexual activity: Yes    Birth control/protection: Surgical  Other Topics Concern  Not on file  Social History Narrative   ** Merged History Encounter **       Social Determinants of Health   Financial Resource Strain: Not on file  Food Insecurity: Not on file  Transportation Needs: Not on file  Physical Activity: Not on file  Stress: Not on file  Social Connections: Not on file  Intimate Partner Violence: Not on file    FAMILY HISTORY Family History  Problem Relation Age of Onset   Stroke Mother    Hypertension Mother    Ovarian cancer Mother    Hypercholesterolemia Father    Heart murmur Father    Heart disease Father     Hypertension Sister    Hypercholesterolemia Sister    Stroke Sister     ALLERGIES:  has No Known Allergies.  MEDICATIONS:  Current Outpatient Medications  Medication Sig Dispense Refill   cyclobenzaprine (FLEXERIL) 10 MG tablet TAKE ONE TABLET BY MOUTH 3 TIMES DAILY FOR MUSCLE SPASMS 90 tablet 1   gabapentin (NEURONTIN) 300 MG capsule Take 300 mg by mouth at bedtime.     LORazepam (ATIVAN) 1 MG tablet TAKE ONE TABLET BY MOUTH AT BEDTIME 30 tablet 0   losartan (COZAAR) 25 MG tablet Take 1 tablet (25 mg total) by mouth daily. 90 tablet 0   meloxicam (MOBIC) 7.5 MG tablet Take 7.5 mg by mouth daily.     methocarbamol (ROBAXIN) 500 MG tablet Take 500 mg by mouth daily as needed.     rosuvastatin (CRESTOR) 5 MG tablet Take 1 tablet (5 mg total) by mouth daily. 90 tablet 1   No current facility-administered medications for this visit.    PHYSICAL EXAMINATION:  ECOG PERFORMANCE STATUS: 1 - Symptomatic but completely ambulatory   Vitals:   12/03/22 1345  BP: 128/86  Pulse: 73  Resp: 16  SpO2: 98%    Filed Weights   12/03/22 1345  Weight: 158 lb (71.7 kg)     Physical Exam Vitals and nursing note reviewed.  Constitutional:      General: She is not in acute distress.    Appearance: Normal appearance. She is normal weight. She is not toxic-appearing or diaphoretic.     Comments: Here alone  HENT:     Head: Normocephalic and atraumatic.     Right Ear: External ear normal.     Left Ear: External ear normal.     Nose: Nose normal. No congestion or rhinorrhea.  Eyes:     General: No scleral icterus.    Extraocular Movements: Extraocular movements intact.     Conjunctiva/sclera: Conjunctivae normal.     Pupils: Pupils are equal, round, and reactive to light.  Cardiovascular:     Rate and Rhythm: Normal rate and regular rhythm.     Heart sounds: No murmur heard.    No friction rub. No gallop.  Pulmonary:     Effort: Pulmonary effort is normal. No respiratory distress.      Breath sounds: Normal breath sounds.  Abdominal:     General: Bowel sounds are normal.     Palpations: Abdomen is soft.     Tenderness: There is no abdominal tenderness. There is no guarding or rebound.  Musculoskeletal:        General: No swelling, tenderness or deformity.     Cervical back: Normal range of motion and neck supple. No rigidity or tenderness.     Right lower leg: No edema.     Left lower leg: No edema.  Lymphadenopathy:  Head:     Right side of head: No submental, submandibular, tonsillar, preauricular, posterior auricular or occipital adenopathy.     Left side of head: No submental, submandibular, tonsillar, preauricular, posterior auricular or occipital adenopathy.     Cervical: No cervical adenopathy.     Right cervical: No superficial, deep or posterior cervical adenopathy.    Left cervical: No superficial, deep or posterior cervical adenopathy.     Upper Body:     Right upper body: No supraclavicular, axillary, pectoral or epitrochlear adenopathy.     Left upper body: No supraclavicular, axillary, pectoral or epitrochlear adenopathy.  Skin:    General: Skin is warm.     Coloration: Skin is not jaundiced.  Neurological:     General: No focal deficit present.     Mental Status: She is alert and oriented to person, place, and time.     Cranial Nerves: No cranial nerve deficit.  Psychiatric:        Mood and Affect: Mood normal.        Behavior: Behavior normal.        Thought Content: Thought content normal.        Judgment: Judgment normal.      LABORATORY DATA: I have personally reviewed the data as listed:  Office Visit on 11/20/2022  Component Date Value Ref Range Status   WBC 11/20/2022 6.4  3.4 - 10.8 x10E3/uL Final   RBC 11/20/2022 4.10  3.77 - 5.28 x10E6/uL Final   Hemoglobin 11/20/2022 12.9  11.1 - 15.9 g/dL Final   Hematocrit 16/10/9602 39.9  34.0 - 46.6 % Final   MCV 11/20/2022 97  79 - 97 fL Final   MCH 11/20/2022 31.5  26.6 - 33.0 pg  Final   MCHC 11/20/2022 32.3  31.5 - 35.7 g/dL Final   RDW 54/09/8117 12.3  11.7 - 15.4 % Final   Platelets 11/20/2022 231  150 - 450 x10E3/uL Final   Neutrophils 11/20/2022 58  Not Estab. % Final   Lymphs 11/20/2022 30  Not Estab. % Final   Monocytes 11/20/2022 10  Not Estab. % Final   Eos 11/20/2022 1  Not Estab. % Final   Basos 11/20/2022 1  Not Estab. % Final   Neutrophils Absolute 11/20/2022 3.7  1.4 - 7.0 x10E3/uL Final   Lymphocytes Absolute 11/20/2022 1.9  0.7 - 3.1 x10E3/uL Final   Monocytes Absolute 11/20/2022 0.7  0.1 - 0.9 x10E3/uL Final   EOS (ABSOLUTE) 11/20/2022 0.1  0.0 - 0.4 x10E3/uL Final   Basophils Absolute 11/20/2022 0.0  0.0 - 0.2 x10E3/uL Final   Immature Granulocytes 11/20/2022 0  Not Estab. % Final   Immature Grans (Abs) 11/20/2022 0.0  0.0 - 0.1 x10E3/uL Final   Glucose 11/20/2022 84  70 - 99 mg/dL Final   BUN 14/78/2956 14  6 - 24 mg/dL Final   Creatinine, Ser 11/20/2022 0.86  0.57 - 1.00 mg/dL Final   eGFR 21/30/8657 80  >59 mL/min/1.73 Final   BUN/Creatinine Ratio 11/20/2022 16  9 - 23 Final   Sodium 11/20/2022 139  134 - 144 mmol/L Final   Potassium 11/20/2022 4.4  3.5 - 5.2 mmol/L Final   Chloride 11/20/2022 103  96 - 106 mmol/L Final   CO2 11/20/2022 24  20 - 29 mmol/L Final   Calcium 11/20/2022 9.1  8.7 - 10.2 mg/dL Final   Total Protein 84/69/6295 6.6  6.0 - 8.5 g/dL Final   Albumin 28/41/3244 4.4  3.8 - 4.9 g/dL Final  Globulin, Total 11/20/2022 2.2  1.5 - 4.5 g/dL Final   Bilirubin Total 11/20/2022 0.2  0.0 - 1.2 mg/dL Final   Alkaline Phosphatase 11/20/2022 63  44 - 121 IU/L Final   AST 11/20/2022 23  0 - 40 IU/L Final   ALT 11/20/2022 18  0 - 32 IU/L Final   Cholesterol, Total 11/20/2022 172  100 - 199 mg/dL Final   Triglycerides 40/98/1191 87  0 - 149 mg/dL Final   HDL 47/82/9562 77  >39 mg/dL Final   VLDL Cholesterol Cal 11/20/2022 16  5 - 40 mg/dL Final   LDL Chol Calc (NIH) 11/20/2022 79  0 - 99 mg/dL Final   Chol/HDL Ratio 11/20/2022  2.2  0.0 - 4.4 ratio Final   Comment:                                   T. Chol/HDL Ratio                                             Men  Women                               1/2 Avg.Risk  3.4    3.3                                   Avg.Risk  5.0    4.4                                2X Avg.Risk  9.6    7.1                                3X Avg.Risk 23.4   11.0    Total Iron Binding Capacity 11/20/2022 288  250 - 450 ug/dL Final   UIBC 13/08/6576 262  131 - 425 ug/dL Final   Iron 46/96/2952 26 (L)  27 - 159 ug/dL Final   Iron Saturation 11/20/2022 9 (LL)  15 - 55 % Final   Ferritin 11/20/2022 232 (H)  15 - 150 ng/mL Final    RADIOGRAPHIC STUDIES: I have personally reviewed the radiological images as listed and agree with the findings in the report  No results found.  ASSESSMENT/PLAN  52 y.o. female is here because of elevated ferritin.  Medical history notable for intractable headaches, arthritis, back pain, spinal fusions  Elevated Ferritin  April 12, 2022: Ferritin 206 iron saturation 36 hemoglobin 13.2.  CMP normal   November 20, 2022: Ferritin 232 iron saturation 9%  December 03 2022- Reviewed results of labs and history with patient.  There is nothing by history or lab testing to suggest iron accumulation.  Patient is not anemic, is without history of iron supplementation, chronic transfusion.  A diagnosis of hemochromatosis is doubtful as she has no evidence for iron loading despite being amenorrheic for 20+ years.  Nothing by history to suggest malignancy.  Most likely etiologies for the slightly elevated ferritin are 1) chronic inflammation 2) regular use of EtOH.  Can also consider U/S to  evaluate for hepatic steatosis. At this time there is no indication for further hematology evaluation    Increased bruising  December 03 2022- Likely related to use of Mobic and NSAIDS.  No history of sinister bleeding to suggest an inherited disorder.  Can follow clinically at this time.     Disposition  December 03 2022- Provided reassurance to patient.  Can see again as needed     Cancer Staging  No matching staging information was found for the patient.    No problem-specific Assessment & Plan notes found for this encounter.    No orders of the defined types were placed in this encounter.   49  minutes was spent in patient care.  This included time spent preparing to see the patient (e.g., review of tests), obtaining and/or reviewing separately obtained history, counseling and educating the patient/family/caregiver, ordering medications, tests, or procedures; documenting clinical information in the electronic or other health record, independently interpreting results and communicating results to the patient/family/caregiver as well as coordination of care.       All questions were answered. The patient knows to call the clinic with any problems, questions or concerns.  This note was electronically signed.    Loni Muse, MD  12/03/2022 2:32 PM

## 2022-12-10 ENCOUNTER — Other Ambulatory Visit: Payer: Self-pay | Admitting: Physician Assistant

## 2022-12-30 ENCOUNTER — Other Ambulatory Visit: Payer: Self-pay | Admitting: Physician Assistant

## 2022-12-30 DIAGNOSIS — E782 Mixed hyperlipidemia: Secondary | ICD-10-CM

## 2023-01-08 ENCOUNTER — Telehealth: Payer: Self-pay | Admitting: Physician Assistant

## 2023-01-08 NOTE — Telephone Encounter (Signed)
[employer] [id number]

## 2023-01-23 NOTE — Telephone Encounter (Signed)
 Per sally, we are not doing her FMLA. Patient notified. Patient stated that the paper work should have been sent to ortho.

## 2023-02-07 ENCOUNTER — Other Ambulatory Visit: Payer: Self-pay

## 2023-02-07 MED ORDER — LOSARTAN POTASSIUM 25 MG PO TABS: 25.0000 mg | ORAL_TABLET | Freq: Every day | ORAL | 0 refills | Status: DC

## 2023-02-07 NOTE — Telephone Encounter (Signed)
Copied from CRM [id number]. Topic: Clinical - Medication Question >> Feb 07, 2023 10:04 AM Tammy [name] O wrote: Reason for CRM: patient is calling about losartan (COZAAR) 25 MG tablet  medication and needing it right now . Patient say medication was late and now she is out and it doesn't show until the 31st of this month when they will release the order . The pharmacy told patient to call for mail order to see if doctor can prescribe a month supply till the release date of her order  of the losartan. Patient would like for medication to be sent to urgent care pharmacy in [place] if this is possible

## 2023-02-07 NOTE — Telephone Encounter (Signed)
Called and spoke with [facility] with [employer], she stated she is unsure who informed patient that her mail order would not be sent until next month however she confirmed that her rx for losartan has been processed and shipped. She is not provided with a tracking number and is unaware of when she will receive it. Patient aware of this and request a weeks worth of medication sent to her pharmacy.

## 2023-02-10 ENCOUNTER — Other Ambulatory Visit: Payer: Self-pay | Admitting: Physician Assistant

## 2023-02-18 ENCOUNTER — Other Ambulatory Visit: Payer: Self-pay | Admitting: Physician Assistant

## 2023-03-13 ENCOUNTER — Other Ambulatory Visit: Payer: Self-pay | Admitting: Physician Assistant

## 2023-03-17 ENCOUNTER — Telehealth: Payer: Self-pay

## 2023-03-17 NOTE — Telephone Encounter (Signed)
 Copied from CRM [id number]. Topic: General - Other >> Mar 14, 2023  3:58 PM Alvino Blood C wrote: Reason for CRM: Raynelle Fanning from [employer] is calling to get an update on Disability paperwork and would like a call back at [phone]

## 2023-03-17 NOTE — Telephone Encounter (Signed)
 [employer] id #:[id number]  I called [employer] but did not speak with [facility]. I informed them that Per sally, we are not doing her FMLA. Patient stated that the paper work should have been sent to ortho.

## 2023-03-31 ENCOUNTER — Other Ambulatory Visit: Payer: Self-pay | Admitting: Physician Assistant

## 2023-03-31 DIAGNOSIS — E782 Mixed hyperlipidemia: Secondary | ICD-10-CM

## 2023-03-31 MED ORDER — ROSUVASTATIN CALCIUM 5 MG PO TABS
5.0000 mg | ORAL_TABLET | Freq: Every day | ORAL | 1 refills | Status: DC
Start: 2023-03-31 — End: 2023-04-23

## 2023-03-31 NOTE — Telephone Encounter (Signed)
 Copied from CRM [id number]. Topic: Clinical - Medication Refill >> Mar 31, 2023  3:13 PM Everette C wrote: Most Recent Primary Care Visit:  Provider: Marianne Sofia  Department: [facility]  Visit Type: OFFICE VISIT  Date: 11/20/2022  Medication: rosuvastatin (CRESTOR) 5 MG tablet [[id number]] - The patient will need a 90 day supply   Has the patient contacted their pharmacy? Yes (Agent: If no, request that the patient contact the pharmacy for the refill. If patient does not wish to contact the pharmacy document the reason why and proceed with request.) (Agent: If yes, when and what did the pharmacy advise?)  Is this the correct pharmacy for this prescription? Yes If no, delete pharmacy and type the correct one.  This is the patient's preferred pharmacy:  [id number] - [place], [place] - [address] AT [facility] [address] [place] [address] Phone: [phone] Fax: [phone] Hours: Not open 24 hours     Has the prescription been filled recently? Yes  Is the patient out of the medication? Yes  Has the patient been seen for an appointment in the last year OR does the patient have an upcoming appointment? Yes  Can we respond through MyChart? No  Agent: Please be advised that Rx refills may take up to 3 business days. We ask that you follow-up with your pharmacy.

## 2023-04-14 ENCOUNTER — Encounter: Payer: BC Managed Care – PPO | Admitting: Physician Assistant

## 2023-04-15 ENCOUNTER — Other Ambulatory Visit: Payer: Self-pay | Admitting: Physician Assistant

## 2023-04-15 ENCOUNTER — Telehealth: Payer: Self-pay

## 2023-04-15 DIAGNOSIS — R928 Other abnormal and inconclusive findings on diagnostic imaging of breast: Secondary | ICD-10-CM

## 2023-04-15 NOTE — Telephone Encounter (Signed)
 Copied from CRM [id number]. Topic: Clinical - Request for Lab/Test Order >> Apr 15, 2023  9:04 AM Fuller Mandril wrote: Reason for CRM:  Pt called states she received notice from hospital that it is time to schedule mammogram. Needs order sent for Diagnostic Bilateral mammogram to [facility]. Thank You

## 2023-04-23 ENCOUNTER — Other Ambulatory Visit: Payer: Self-pay | Admitting: Physician Assistant

## 2023-04-23 DIAGNOSIS — E782 Mixed hyperlipidemia: Secondary | ICD-10-CM

## 2023-04-23 MED ORDER — ROSUVASTATIN CALCIUM 5 MG PO TABS
5.0000 mg | ORAL_TABLET | Freq: Every day | ORAL | 0 refills | Status: DC
Start: 2023-04-23 — End: 2023-07-16

## 2023-04-23 NOTE — Telephone Encounter (Signed)
 Copied from CRM [id number]. Topic: Clinical - Medication Refill >> Apr 23, 2023 10:00 AM Shelah Lewandowsky wrote: Most Recent Primary Care Visit:  Provider: Marianne Sofia  Department: [facility]  Visit Type: OFFICE VISIT  Date: 11/20/2022  Medication: rosuvastatin (CRESTOR) 5 MG tablet   Has the patient contacted their pharmacy? Yes (Agent: If no, request that the patient contact the pharmacy for the refill. If patient does not wish to contact the pharmacy document the reason why and proceed with request.) (Agent: If yes, when and what did the pharmacy advise?)  Is this the correct pharmacy for this prescription? Yes If no, delete pharmacy and type the correct one.  This is the patient's preferred pharmacy:    [id number] - [place],  - [address] AT [facility] [address] [place] [address] Phone: [phone] Fax: [phone]   Has the prescription been filled recently? Yes  Is the patient out of the medication? No  Has the patient been seen for an appointment in the last year OR does the patient have an upcoming appointment? Yes  Can we respond through MyChart? Yes  Agent: Please be advised that Rx refills may take up to 3 business days. We ask that you follow-up with your pharmacy.

## 2023-04-28 ENCOUNTER — Telehealth: Payer: Self-pay

## 2023-04-28 NOTE — Telephone Encounter (Signed)
 Copied from CRM [id number]. Topic: Referral - Question >> Apr 28, 2023  8:47 AM Fredrich Romans wrote: Reason for CRM: [facility] scheduling department called to have order for bilateral diagnostic mammogram. Patient wante dto have this done however they need an order first.  Fax number:[phone]

## 2023-04-29 DIAGNOSIS — S32009K Unspecified fracture of unspecified lumbar vertebra, subsequent encounter for fracture with nonunion: Secondary | ICD-10-CM | POA: Diagnosis not present

## 2023-04-30 ENCOUNTER — Encounter: Admitting: Physician Assistant

## 2023-05-05 ENCOUNTER — Encounter: Payer: Self-pay | Admitting: Physician Assistant

## 2023-05-05 ENCOUNTER — Ambulatory Visit (INDEPENDENT_AMBULATORY_CARE_PROVIDER_SITE_OTHER): Admitting: Physician Assistant

## 2023-05-05 VITALS — BP 128/82 | HR 79 | Temp 97.8°F | Ht 67.5 in | Wt 160.4 lb

## 2023-05-05 DIAGNOSIS — R899 Unspecified abnormal finding in specimens from other organs, systems and tissues: Secondary | ICD-10-CM

## 2023-05-05 DIAGNOSIS — Z720 Tobacco use: Secondary | ICD-10-CM

## 2023-05-05 DIAGNOSIS — M545 Low back pain, unspecified: Secondary | ICD-10-CM | POA: Diagnosis not present

## 2023-05-05 DIAGNOSIS — Z0000 Encounter for general adult medical examination without abnormal findings: Secondary | ICD-10-CM

## 2023-05-05 DIAGNOSIS — G8929 Other chronic pain: Secondary | ICD-10-CM

## 2023-05-05 DIAGNOSIS — R3121 Asymptomatic microscopic hematuria: Secondary | ICD-10-CM

## 2023-05-05 LAB — POCT URINALYSIS DIP (CLINITEK)
Bilirubin, UA: NEGATIVE
Glucose, UA: NEGATIVE mg/dL
Ketones, POC UA: NEGATIVE mg/dL
Leukocytes, UA: NEGATIVE
Nitrite, UA: NEGATIVE
POC PROTEIN,UA: NEGATIVE
Spec Grav, UA: 1.01 (ref 1.010–1.025)
Urobilinogen, UA: 0.2 U/dL
pH, UA: 6.5 (ref 5.0–8.0)

## 2023-05-05 MED ORDER — CYCLOBENZAPRINE HCL 10 MG PO TABS: ORAL_TABLET | ORAL | 1 refills | Status: DC

## 2023-05-05 MED ORDER — LORAZEPAM 1 MG PO TABS: 1.0000 mg | ORAL_TABLET | Freq: Every day | ORAL | 1 refills | Status: DC

## 2023-05-05 NOTE — Progress Notes (Signed)
 Subjective:  Patient ID: Tammy [name], female    DOB: 17-Jan-1971  Age: 53 y.o. MRN: [id number]  Chief Complaint  Patient presents with   Annual Exam    HPI Well Adult Physical: Patient here for a comprehensive physical exam.The patient reports no problems Do you take any herbs or supplements that were not prescribed by a doctor? no Are you taking calcium supplements? no Are you taking aspirin daily? no  Encounter for general adult medical examination without abnormal findings  Physical ("At Risk" items are starred): Patient's last physical exam was 1 year ago .  Patient is not afflicted from Stress Incontinence and Urge Incontinence  Patient wears a seat belts Patient has smoke detectors and has carbon monoxide detectors. Patient practices appropriate gun safety. Patient wears sunscreen with extended sun exposure. Dental Care: brushes and flosses daily. Last dental visit: up to date Vision impairments: none Hearing loss: none  No LMP recorded. Patient has had a hysterectomy.  Safe at home: Yes Self breast exams: Yes  Last mammogram: scheduled for bilateral diagnostic mammogram 6/25 Pt with history of elevated ferritin and low iron - due to recheck    05/05/2023    3:01 PM 11/20/2022   11:04 AM 01/28/2022   10:05 AM 04/09/2021    2:10 PM  Depression screen PHQ 2/9  Decreased Interest 0 0 0 0  Down, Depressed, Hopeless 0 0 0 0  PHQ - 2 Score 0 0 0 0  Altered sleeping  0 0   Tired, decreased energy  0 0   Change in appetite  0 0   Feeling bad or failure about yourself   0 0   Trouble concentrating  0 0   Moving slowly or fidgety/restless  0 0   Suicidal thoughts  0 0   PHQ-9 Score  0 0   Difficult doing work/chores  Not difficult at all Not difficult at all          01/17/2020    1:48 PM 01/17/2020    9:00 PM 01/28/2022   10:05 AM 11/20/2022   11:04 AM 05/05/2023    3:01 PM  Fall Risk  Falls in the past year?   1 0 0  Was there an injury with Fall?   0 0  0  Fall Risk Category Calculator   1 0 0  Fall Risk Category (Retired)   Low    (RETIRED) Patient Fall Risk Level Moderate fall risk Moderate fall risk Low fall risk    Patient at Risk for Falls Due to   History of fall(s) No Fall Risks No Fall Risks  Fall risk Follow up   Falls evaluation completed Falls evaluation completed              Social Hx   Social History   Socioeconomic History   Marital status: Married    Spouse name: Not on file   Number of children: Not on file   Years of education: Not on file   Highest education level: Some college, no degree  Occupational History   Not on file  Tobacco Use   Smoking status: Former    Current packs/day: 0.00    Average packs/day: 1 pack/day for 25.0 years (25.0 ttl pk-yrs)    Types: Cigarettes    Start date: 11    Quit date: 2018    Years since quitting: 7.2   Smokeless tobacco: Never  Vaping Use   Vaping status: Every Day  Substance and  Sexual Activity   Alcohol use: Yes    Alcohol/week: 10.0 standard drinks of alcohol    Types: 10 Cans of beer per week    Comment: SOCIAL   Drug use: No   Sexual activity: Yes    Birth control/protection: Surgical  Other Topics Concern   Not on file  Social History Narrative   ** Merged History Encounter **       Social Drivers of Health   Financial Resource Strain: Low Risk  (05/01/2023)   Overall Financial Resource Strain (CARDIA)    Difficulty of Paying Living Expenses: Not hard at all  Food Insecurity: No Food Insecurity (05/01/2023)   Hunger Vital Sign    Worried About Running Out of Food in the Last Year: Never true    Ran Out of Food in the Last Year: Never true  Transportation Needs: No Transportation Needs (05/01/2023)   PRAPARE - Administrator, Civil Service (Medical): No    Lack of Transportation (Non-Medical): No  Physical Activity: Insufficiently Active (05/01/2023)   Exercise Vital Sign    Days of Exercise per Week: 2 days    Minutes of Exercise  per Session: 30 min  Stress: No Stress Concern Present (05/01/2023)   [employer] of Occupational Health - Occupational Stress Questionnaire    Feeling of Stress : Not at all  Social Connections: Moderately Integrated (05/01/2023)   Social Connection and Isolation Panel [NHANES]    Frequency of Communication with Friends and Family: More than three times a week    Frequency of Social Gatherings with Friends and Family: Once a week    Attends Religious Services: 1 to 4 times per year    Active Member of [employer] or Organizations: No    Attends Engineer, structural: Not on file    Marital Status: Married   Past Medical History:  Diagnosis Date   Acute nonintractable headache 01/21/2021   Arthritis    Back pain    Close exposure to COVID-19 virus 01/21/2021   Degeneration of lumbar intervertebral disc 02/27/2016   Displacement of lumbar intervertebral disc without myelopathy 02/27/2016   Idiopathic scoliosis 02/27/2016   Low back pain 02/27/2016   Nail fungus    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   S/P lumbar fusion 01/17/2020   S/P lumbar spinal fusion 07/10/2016   Past Surgical History:  Procedure Laterality Date   ABDOMINAL HYSTERECTOMY     BACK SURGERY     BUNIONECTOMY     RIGHT FOOT   MAXIMUM ACCESS (MAS)POSTERIOR LUMBAR INTERBODY FUSION (PLIF) 3 LEVEL N/A 07/10/2016   Procedure: Lumbar two-Lumbar three - Lumbar three-Lumbar four - Lumbar four-Lumbar five MAXIMUM ACCESS (MAS) POSTERIOR LUMBAR INTERBODY FUSION;  Surgeon: Tia Alert, MD;  Location: [facility] OR;  Service: Neurosurgery;  Laterality: N/A;   TONSILLECTOMY     TUBAL LIGATION      Family History  Problem Relation Age of Onset   Stroke Mother    Hypertension Mother    Ovarian cancer Mother    Hypercholesterolemia Father    Heart murmur Father    Heart disease Father    Hypertension Sister    Hypercholesterolemia Sister    Stroke Sister     ROS CONSTITUTIONAL: Negative for chills, fatigue, fever, unintentional weight gain  and unintentional weight loss.  E/N/T: Negative for ear pain, nasal congestion and sore throat.  CARDIOVASCULAR: Negative for chest pain, dizziness, palpitations and pedal edema.  RESPIRATORY: Negative for recent cough and dyspnea.  GASTROINTESTINAL: Negative for  abdominal pain, acid reflux symptoms, constipation, diarrhea, nausea and vomiting.  MSK: Negative for arthralgias and myalgias.  INTEGUMENTARY: Negative for rash.  NEUROLOGICAL: Negative for dizziness and headaches.  PSYCHIATRIC: Negative for sleep disturbance and to question depression screen.  Negative for depression, negative for anhedonia.   Objective:  PHYSICAL EXAM:   BP 128/82   Pulse 79   Temp 97.8 F (36.6 C)   Ht 5' 7.5" (1.715 m)   Wt 160 lb 6.4 oz (72.8 kg)   SpO2 98%   BMI 24.75 kg/m   Vision Screening   Right eye Left eye Both eyes  Without correction 20/32 20/25 20/25   With correction       GEN: Well nourished, well developed, in no acute distress  HEENT: normal external ears and nose - normal external auditory canals and TMS - hearing grossly normal - - Lips, Teeth and Gums - normal  Oropharynx - normal mucosa, palate, and posterior pharynx Neck: no JVD or masses - no thyromegaly Cardiac: RRR; no murmurs, rubs, or gallops,no edema - no significant varicosities Respiratory:  normal respiratory rate and pattern with no distress - normal breath sounds with no rales, rhonchi, wheezes or rubs GI: normal bowel sounds, no masses or tenderness MS: no deformity or atrophy  Skin: warm and dry, no rash  Neuro:  Alert and Oriented x 3, Strength and sensation are intact - CN II-Xii grossly intact Psych: euthymic mood, appropriate affect and demeanor  Office Visit on 05/05/2023  Component Date Value Ref Range Status   Color, UA 05/05/2023 yellow  yellow Final   Clarity, UA 05/05/2023 clear  clear Final   Glucose, UA 05/05/2023 negative  negative mg/dL Final   Bilirubin, UA 16/10/9602 negative  negative Final    Ketones, POC UA 05/05/2023 negative  negative mg/dL Final   Spec Grav, UA 54/09/8117 1.010  1.010 - 1.025 Final   Blood, UA 05/05/2023 trace-lysed (A)  negative Final   pH, UA 05/05/2023 6.5  5.0 - 8.0 Final   POC PROTEIN,UA 05/05/2023 negative  negative, trace Final   Urobilinogen, UA 05/05/2023 0.2  0.2 or 1.0 E.U./dL Final   Nitrite, UA 14/78/2956 Negative  Negative Final   Leukocytes, UA 05/05/2023 Negative  Negative Final    Assessment & Plan:  Annual physical exam -     CBC with Differential/Platelet -     Comprehensive metabolic panel with GFR -     TSH -     Lipid panel -     Iron, TIBC and Ferritin Panel -     POCT URINALYSIS DIP (CLINITEK)  Abnormal laboratory test -     Iron, TIBC and Ferritin Panel Hemoccult cards given Asymptomatic microscopic hematuria -     POCT URINALYSIS DIP (CLINITEK)    This is a list of the screening recommended for you and due dates:  Health Maintenance  Topic Date Due   Mammogram  06/22/2023*   Zoster (Shingles) Vaccine (1 of 2) 08/04/2023*   Screening for Lung Cancer  11/20/2023*   DTaP/Tdap/Td vaccine (1 - Tdap) 11/20/2023*   Flu Shot  08/22/2023   Colon Cancer Screening  02/04/2029   HPV Vaccine  Aged Out   Meningitis B Vaccine  Aged Out   COVID-19 Vaccine  Discontinued   Hepatitis C Screening  Discontinued   HIV Screening  Discontinued  *Topic was postponed. The date shown is not the original due date.     Follow-up: Return in about 6 months (around 11/04/2023) for chronic  fasting follow-up.  An After Visit Summary was printed and given to the patient.  Anthonette Bastos [facility] [phone]

## 2023-05-06 ENCOUNTER — Encounter: Payer: Self-pay | Admitting: Physician Assistant

## 2023-05-06 LAB — COMPREHENSIVE METABOLIC PANEL WITH GFR
ALT: 19 IU/L (ref 0–32)
AST: 17 IU/L (ref 0–40)
Albumin: 4.6 g/dL (ref 3.8–4.9)
Alkaline Phosphatase: 58 IU/L (ref 44–121)
BUN/Creatinine Ratio: 23 (ref 9–23)
BUN: 18 mg/dL (ref 6–24)
Bilirubin Total: 0.5 mg/dL (ref 0.0–1.2)
CO2: 25 mmol/L (ref 20–29)
Calcium: 9.8 mg/dL (ref 8.7–10.2)
Chloride: 100 mmol/L (ref 96–106)
Creatinine, Ser: 0.79 mg/dL (ref 0.57–1.00)
Globulin, Total: 2.1 g/dL (ref 1.5–4.5)
Glucose: 104 mg/dL — ABNORMAL HIGH (ref 70–99)
Potassium: 4.1 mmol/L (ref 3.5–5.2)
Sodium: 139 mmol/L (ref 134–144)
Total Protein: 6.7 g/dL (ref 6.0–8.5)
eGFR: 89 mL/min/{1.73_m2} (ref >59)

## 2023-05-06 LAB — CBC WITH DIFFERENTIAL/PLATELET
Basophils Absolute: 0 10*3/uL (ref 0.0–0.2)
Basos: 1 %
EOS (ABSOLUTE): 0.1 10*3/uL (ref 0.0–0.4)
Eos: 1 %
Hematocrit: 38.7 % (ref 34.0–46.6)
Hemoglobin: 12.9 g/dL (ref 11.1–15.9)
Immature Grans (Abs): 0 10*3/uL (ref 0.0–0.1)
Immature Granulocytes: 0 %
Lymphocytes Absolute: 2.8 10*3/uL (ref 0.7–3.1)
Lymphs: 48 %
MCH: 30.9 pg (ref 26.6–33.0)
MCHC: 33.3 g/dL (ref 31.5–35.7)
MCV: 93 fL (ref 79–97)
Monocytes Absolute: 0.5 10*3/uL (ref 0.1–0.9)
Monocytes: 8 %
Neutrophils Absolute: 2.5 10*3/uL (ref 1.4–7.0)
Neutrophils: 42 %
Platelets: 250 10*3/uL (ref 150–450)
RBC: 4.17 x10E6/uL (ref 3.77–5.28)
RDW: 12.5 % (ref 11.7–15.4)
WBC: 5.8 10*3/uL (ref 3.4–10.8)

## 2023-05-06 LAB — LIPID PANEL
Chol/HDL Ratio: 2.5 ratio (ref 0.0–4.4)
Cholesterol, Total: 184 mg/dL (ref 100–199)
HDL: 75 mg/dL (ref >39)
LDL Chol Calc (NIH): 95 mg/dL (ref 0–99)
Triglycerides: 77 mg/dL (ref 0–149)
VLDL Cholesterol Cal: 14 mg/dL (ref 5–40)

## 2023-05-06 LAB — IRON,TIBC AND FERRITIN PANEL
Ferritin: 204 ng/mL — ABNORMAL HIGH (ref 15–150)
Iron Saturation: 22 % (ref 15–55)
Iron: 66 ug/dL (ref 27–159)
Total Iron Binding Capacity: 304 ug/dL (ref 250–450)
UIBC: 238 ug/dL (ref 131–425)

## 2023-05-06 LAB — TSH: TSH: 1.01 u[IU]/mL (ref 0.450–4.500)

## 2023-05-13 ENCOUNTER — Ambulatory Visit (INDEPENDENT_AMBULATORY_CARE_PROVIDER_SITE_OTHER)

## 2023-05-13 ENCOUNTER — Encounter: Payer: Self-pay | Admitting: Physician Assistant

## 2023-05-13 DIAGNOSIS — R899 Unspecified abnormal finding in specimens from other organs, systems and tissues: Secondary | ICD-10-CM

## 2023-05-13 LAB — POC HEMOCCULT BLD/STL (HOME/3-CARD/SCREEN)
Card #2 Fecal Occult Blod, POC: NEGATIVE
Card #3 Fecal Occult Blood, POC: NEGATIVE
Fecal Occult Blood, POC: NEGATIVE

## 2023-05-13 NOTE — Progress Notes (Signed)
 Patient drop off  Hemoccult cards . Patient Hemoccult cards were all negative.

## 2023-05-16 ENCOUNTER — Other Ambulatory Visit: Payer: Self-pay | Admitting: Physician Assistant

## 2023-05-21 ENCOUNTER — Ambulatory Visit: Payer: BC Managed Care – PPO | Admitting: Physician Assistant

## 2023-07-04 ENCOUNTER — Encounter: Payer: Self-pay | Admitting: Physician Assistant

## 2023-07-08 ENCOUNTER — Ambulatory Visit (INDEPENDENT_AMBULATORY_CARE_PROVIDER_SITE_OTHER): Payer: Self-pay

## 2023-07-08 DIAGNOSIS — Z23 Encounter for immunization: Secondary | ICD-10-CM

## 2023-07-08 NOTE — Progress Notes (Signed)
 Patient is in office today for a nurse visit for Immunization. Patient Injection was given in the  Right deltoid. Patient tolerated injection well.

## 2023-07-09 ENCOUNTER — Encounter: Payer: Self-pay | Admitting: Physician Assistant

## 2023-07-09 DIAGNOSIS — Z720 Tobacco use: Secondary | ICD-10-CM | POA: Diagnosis not present

## 2023-07-09 DIAGNOSIS — R928 Other abnormal and inconclusive findings on diagnostic imaging of breast: Secondary | ICD-10-CM | POA: Diagnosis not present

## 2023-07-09 LAB — HM MAMMOGRAPHY

## 2023-07-14 ENCOUNTER — Ambulatory Visit: Payer: Self-pay | Admitting: Physician Assistant

## 2023-07-14 ENCOUNTER — Encounter: Payer: Self-pay | Admitting: Physician Assistant

## 2023-07-14 DIAGNOSIS — F17210 Nicotine dependence, cigarettes, uncomplicated: Secondary | ICD-10-CM | POA: Diagnosis not present

## 2023-07-14 DIAGNOSIS — Z720 Tobacco use: Secondary | ICD-10-CM | POA: Diagnosis not present

## 2023-07-14 DIAGNOSIS — R928 Other abnormal and inconclusive findings on diagnostic imaging of breast: Secondary | ICD-10-CM | POA: Diagnosis not present

## 2023-07-14 DIAGNOSIS — Z122 Encounter for screening for malignant neoplasm of respiratory organs: Secondary | ICD-10-CM | POA: Diagnosis not present

## 2023-07-15 ENCOUNTER — Encounter: Payer: Self-pay | Admitting: Physician Assistant

## 2023-07-15 ENCOUNTER — Other Ambulatory Visit: Payer: Self-pay

## 2023-07-15 DIAGNOSIS — I2510 Atherosclerotic heart disease of native coronary artery without angina pectoris: Secondary | ICD-10-CM

## 2023-07-16 ENCOUNTER — Other Ambulatory Visit: Payer: Self-pay | Admitting: Family Medicine

## 2023-07-16 DIAGNOSIS — E782 Mixed hyperlipidemia: Secondary | ICD-10-CM

## 2023-07-16 MED ORDER — ROSUVASTATIN CALCIUM 20 MG PO TABS
20.0000 mg | ORAL_TABLET | Freq: Every day | ORAL | 0 refills | Status: DC
Start: 2023-07-16 — End: 2023-07-21

## 2023-07-17 ENCOUNTER — Telehealth: Payer: Self-pay

## 2023-07-17 NOTE — Telephone Encounter (Signed)
 Spoke with patient regarding her concerns. She states she is not having any breathing issues and does not feel the need for a inhaler. Also stated she has an appt with Cardiology 07/28/23.  Copied from CRM [id number]. Topic: Clinical - Request for Lab/Test Order >> Jul 17, 2023  9:55 AM Treva T wrote: Reason for CRM: Patient calling requesting to speak with a nurse as soon as possible regarding Lung CT test results.  Per patient is really concerned about results and would like to speak to nurse for a more detailed breakdown of imaging results.  Patient can be reached at [phone], per patient has a dentist appointment around 11:00 am today, will be available after 12:00 pm to discuss further.

## 2023-07-18 ENCOUNTER — Other Ambulatory Visit: Payer: Self-pay | Admitting: Physician Assistant

## 2023-07-20 ENCOUNTER — Other Ambulatory Visit: Payer: Self-pay | Admitting: Physician Assistant

## 2023-07-20 DIAGNOSIS — E782 Mixed hyperlipidemia: Secondary | ICD-10-CM

## 2023-07-21 ENCOUNTER — Other Ambulatory Visit: Payer: Self-pay | Admitting: Physician Assistant

## 2023-07-21 DIAGNOSIS — E782 Mixed hyperlipidemia: Secondary | ICD-10-CM

## 2023-07-21 MED ORDER — ROSUVASTATIN CALCIUM 20 MG PO TABS
20.0000 mg | ORAL_TABLET | Freq: Every day | ORAL | 0 refills | Status: DC
Start: 2023-07-21 — End: 2023-10-22

## 2023-07-31 DIAGNOSIS — R002 Palpitations: Secondary | ICD-10-CM | POA: Diagnosis not present

## 2023-07-31 DIAGNOSIS — R0789 Other chest pain: Secondary | ICD-10-CM | POA: Diagnosis not present

## 2023-08-04 ENCOUNTER — Other Ambulatory Visit: Payer: Self-pay | Admitting: Internal Medicine

## 2023-08-04 DIAGNOSIS — R002 Palpitations: Secondary | ICD-10-CM

## 2023-08-04 DIAGNOSIS — R9389 Abnormal findings on diagnostic imaging of other specified body structures: Secondary | ICD-10-CM

## 2023-08-04 DIAGNOSIS — R0789 Other chest pain: Secondary | ICD-10-CM

## 2023-08-05 ENCOUNTER — Ambulatory Visit

## 2023-08-08 ENCOUNTER — Ambulatory Visit

## 2023-08-19 ENCOUNTER — Other Ambulatory Visit: Payer: Self-pay

## 2023-08-19 MED ORDER — LOSARTAN POTASSIUM 25 MG PO TABS: 25.0000 mg | ORAL_TABLET | Freq: Every day | ORAL | 0 refills | Status: DC

## 2023-08-20 ENCOUNTER — Telehealth (HOSPITAL_COMMUNITY): Payer: Self-pay | Admitting: Emergency Medicine

## 2023-08-20 NOTE — Telephone Encounter (Signed)
 Reaching out to patient to offer assistance regarding upcoming cardiac imaging study; pt verbalizes understanding of appt date/time, parking situation and where to check in, pre-test NPO status and medications ordered, and verified current allergies; name and call back number provided for further questions should they arise Rockwell Alexandria RN Navigator Cardiac Imaging [facility] [phone] office [phone] cell

## 2023-08-21 ENCOUNTER — Ambulatory Visit
Admission: RE | Admit: 2023-08-21 | Discharge: 2023-08-21 | Disposition: A | Discharge status: Home / Self Care | Source: Ambulatory Visit | Attending: Internal Medicine | Admitting: Internal Medicine

## 2023-08-21 DIAGNOSIS — R002 Palpitations: Secondary | ICD-10-CM | POA: Diagnosis not present

## 2023-08-21 DIAGNOSIS — R9389 Abnormal findings on diagnostic imaging of other specified body structures: Secondary | ICD-10-CM | POA: Diagnosis not present

## 2023-08-21 DIAGNOSIS — R0789 Other chest pain: Secondary | ICD-10-CM | POA: Diagnosis not present

## 2023-08-21 MED ORDER — DILTIAZEM HCL 25 MG/5ML IV SOLN: 10.0000 mg | INTRAVENOUS | Status: DC | PRN

## 2023-08-21 MED ORDER — IOHEXOL 350 MG/ML SOLN
100.0000 mL | Freq: Once | INTRAVENOUS | Status: AC | PRN
  Administered 2023-08-21: 100 mL via INTRAVENOUS

## 2023-08-21 MED ORDER — NITROGLYCERIN 0.4 MG SL SUBL
0.8000 mg | SUBLINGUAL_TABLET | Freq: Once | SUBLINGUAL | Status: AC
  Administered 2023-08-21: 0.8 mg via SUBLINGUAL

## 2023-08-21 MED ORDER — METOPROLOL TARTRATE 5 MG/5ML IV SOLN: 10.0000 mg | Freq: Once | INTRAVENOUS | Status: DC | PRN

## 2023-08-29 ENCOUNTER — Telehealth: Payer: Self-pay | Admitting: Physician Assistant

## 2023-08-29 NOTE — Telephone Encounter (Signed)
 [place]  [employer]

## 2023-09-08 ENCOUNTER — Ambulatory Visit (INDEPENDENT_AMBULATORY_CARE_PROVIDER_SITE_OTHER)

## 2023-09-08 DIAGNOSIS — Z23 Encounter for immunization: Secondary | ICD-10-CM

## 2023-09-08 NOTE — Progress Notes (Signed)
 Patient is in office today for a nurse visit for Immunization. Patient Injection was given in the  Right deltoid. Patient tolerated injection well.  Shingrix  2nd dose

## 2023-09-10 ENCOUNTER — Other Ambulatory Visit: Payer: Self-pay | Admitting: Family Medicine

## 2023-09-12 ENCOUNTER — Other Ambulatory Visit: Payer: Self-pay | Admitting: Neurological Surgery

## 2023-10-08 NOTE — Pre-Procedure Instructions (Signed)
 Surgical Instructions   Your procedure is scheduled on October 15, 2023. Report to [facility] Main Entrance A at 10:00 A.M., then check in with the Admitting office. Any questions or running late day of surgery: call [phone]  Questions prior to your surgery date: call [phone], Monday-Friday, 8am-4pm. If you experience any cold or flu symptoms such as cough, fever, chills, shortness of breath, etc. between now and your scheduled surgery, please notify us  at the above number.     Remember:  Do not eat or drink after midnight the night before your surgery. No mint, gum or hard candy after midnight.      Take these medicines the morning of surgery with A SIP OF WATER: ezetimibe (ZETIA)  rosuvastatin  (CRESTOR )   May take these medicines IF NEEDED: gabapentin  (NEURONTIN )  methocarbamol  (ROBAXIN )   Follow your surgeon's instructions on stopping Aspirin. If no instructions were given, please contact your surgeon's office.   One week prior to surgery, STOP taking any Aleve, Naproxen, Ibuprofen, Motrin, Advil, Goody's, BC's, all herbal medications, fish oil, and non-prescription vitamins, including meloxicam (MOBIC)                      Do NOT Smoke (Tobacco/Vaping) for 24 hours prior to your procedure.  If you use a CPAP at night, you may bring your mask/headgear for your overnight stay.   You will be asked to remove any contacts, glasses, piercing's, hearing aid's, dentures/partials prior to surgery. Please bring cases for these items if needed.    Patients discharged the day of surgery will not be allowed to drive home, and someone needs to stay with them for 24 hours.  SURGICAL WAITING ROOM VISITATION Patients may have no more than 2 support people in the waiting area - these visitors may rotate.   Pre-op nurse will coordinate an appropriate time for 1 ADULT support person, who may not rotate, to accompany patient in pre-op.  Children under the age of 84 must have an  adult with them who is not the patient and must remain in the main waiting area with an adult.  If the patient needs to stay at the hospital during part of their recovery, the visitor guidelines for inpatient rooms apply.  Please refer to the [facility] website for the visitor guidelines for any additional information.   If you received a COVID test during your pre-op visit  it is requested that you wear a mask when out in public, stay away from anyone that may not be feeling well and notify your surgeon if you develop symptoms. If you have been in contact with anyone that has tested positive in the last 10 days please notify you surgeon.      Pre-operative 5 CHG Bathing Instructions   You can play a key role in reducing the risk of infection after surgery. Your skin needs to be as free of germs as possible. You can reduce the number of germs on your skin by washing with CHG (chlorhexidine  gluconate) soap before surgery. CHG is an antiseptic soap that kills germs and continues to kill germs even after washing.   DO NOT use if you have an allergy to chlorhexidine /CHG or antibacterial soaps. If your skin becomes reddened or irritated, stop using the CHG and notify one of our RNs at [phone].   Please shower with the CHG soap starting 4 days before surgery using the following schedule:     Please keep in mind the following:  DO NOT shave, including legs and underarms, starting the day of your first shower.   You may shave your face at any point before/day of surgery.  Place clean sheets on your bed the day you start using CHG soap. Use a clean washcloth (not used since being washed) for each shower. DO NOT sleep with pets once you start using the CHG.   CHG Shower Instructions:  Wash your face and private area with normal soap. If you choose to wash your hair, wash first with your normal shampoo.  After you use shampoo/soap, rinse your hair and body thoroughly to remove shampoo/soap  residue.  Turn the water OFF and apply about 3 tablespoons (45 ml) of CHG soap to a CLEAN washcloth.  Apply CHG soap ONLY FROM YOUR NECK DOWN TO YOUR TOES (washing for 3-5 minutes)  DO NOT use CHG soap on face, private areas, open wounds, or sores.  Pay special attention to the area where your surgery is being performed.  If you are having back surgery, having someone wash your back for you may be helpful. Wait 2 minutes after CHG soap is applied, then you may rinse off the CHG soap.  Pat dry with a clean towel  Put on clean clothes/pajamas   If you choose to wear lotion, please use ONLY the CHG-compatible lotions that are listed below.  Additional instructions for the day of surgery: DO NOT APPLY any lotions, deodorants, cologne, or perfumes.   Do not bring valuables to the hospital. [facility] is not responsible for any belongings/valuables. Do not wear nail polish, gel polish, artificial nails, or any other type of covering on natural nails (fingers and toes) Do not wear jewelry or makeup Put on clean/comfortable clothes.  Please brush your teeth.  Ask your nurse before applying any prescription medications to the skin.     CHG Compatible Lotions   Aveeno Moisturizing lotion  Cetaphil Moisturizing Cream  Cetaphil Moisturizing Lotion  Clairol Herbal Essence Moisturizing Lotion, Dry Skin  Clairol Herbal Essence Moisturizing Lotion, Extra Dry Skin  Clairol Herbal Essence Moisturizing Lotion, Normal Skin  Curel Age Defying Therapeutic Moisturizing Lotion with Alpha Hydroxy  Curel Extreme Care Body Lotion  Curel Soothing Hands Moisturizing Hand Lotion  Curel Therapeutic Moisturizing Cream, Fragrance-Free  Curel Therapeutic Moisturizing Lotion, Fragrance-Free  Curel Therapeutic Moisturizing Lotion, Original Formula  Eucerin Daily Replenishing Lotion  Eucerin Dry Skin Therapy Plus Alpha Hydroxy Crme  Eucerin Dry Skin Therapy Plus Alpha Hydroxy Lotion  Eucerin Original Crme   Eucerin Original Lotion  Eucerin Plus Crme Eucerin Plus Lotion  Eucerin TriLipid Replenishing Lotion  Keri Anti-Bacterial Hand Lotion  Keri Deep Conditioning Original Lotion Dry Skin Formula Softly Scented  Keri Deep Conditioning Original Lotion, Fragrance Free Sensitive Skin Formula  Keri Lotion Fast Absorbing Fragrance Free Sensitive Skin Formula  Keri Lotion Fast Absorbing Softly Scented Dry Skin Formula  Keri Original Lotion  Keri Skin Renewal Lotion Keri Silky Smooth Lotion  Keri Silky Smooth Sensitive Skin Lotion  Nivea Body Creamy Conditioning Oil  Nivea Body Extra Enriched Lotion  Nivea Body Original Lotion  Nivea Body Sheer Moisturizing Lotion Nivea Crme  Nivea Skin Firming Lotion  NutraDerm 30 Skin Lotion  NutraDerm Skin Lotion  NutraDerm Therapeutic Skin Cream  NutraDerm Therapeutic Skin Lotion  ProShield Protective Hand Cream  Provon moisturizing lotion  Please read over the following fact sheets that you were given.

## 2023-10-09 ENCOUNTER — Encounter (HOSPITAL_COMMUNITY)
Admission: RE | Admit: 2023-10-09 | Discharge: 2023-10-09 | Disposition: A | Discharge status: Home / Self Care | Payer: Worker's Compensation | Source: Ambulatory Visit | Attending: Neurological Surgery | Admitting: Neurological Surgery

## 2023-10-09 ENCOUNTER — Encounter (HOSPITAL_COMMUNITY): Payer: Self-pay

## 2023-10-09 ENCOUNTER — Other Ambulatory Visit: Payer: Self-pay

## 2023-10-09 VITALS — BP 120/87 | HR 76 | Temp 97.7°F | Resp 16 | Ht 67.5 in | Wt 162.6 lb

## 2023-10-09 DIAGNOSIS — Z01812 Encounter for preprocedural laboratory examination: Secondary | ICD-10-CM | POA: Insufficient documentation

## 2023-10-09 DIAGNOSIS — I2510 Atherosclerotic heart disease of native coronary artery without angina pectoris: Secondary | ICD-10-CM | POA: Insufficient documentation

## 2023-10-09 DIAGNOSIS — Z7982 Long term (current) use of aspirin: Secondary | ICD-10-CM | POA: Insufficient documentation

## 2023-10-09 DIAGNOSIS — M419 Scoliosis, unspecified: Secondary | ICD-10-CM | POA: Diagnosis not present

## 2023-10-09 DIAGNOSIS — Z01818 Encounter for other preprocedural examination: Secondary | ICD-10-CM | POA: Diagnosis not present

## 2023-10-09 DIAGNOSIS — M960 Pseudarthrosis after fusion or arthrodesis: Secondary | ICD-10-CM | POA: Insufficient documentation

## 2023-10-09 DIAGNOSIS — I10 Essential (primary) hypertension: Secondary | ICD-10-CM | POA: Diagnosis not present

## 2023-10-09 DIAGNOSIS — Z87891 Personal history of nicotine dependence: Secondary | ICD-10-CM | POA: Diagnosis not present

## 2023-10-09 DIAGNOSIS — S32009K Unspecified fracture of unspecified lumbar vertebra, subsequent encounter for fracture with nonunion: Secondary | ICD-10-CM | POA: Diagnosis not present

## 2023-10-09 HISTORY — DX: Essential (primary) hypertension: I10

## 2023-10-09 LAB — CBC
HCT: 42 % (ref 36.0–46.0)
Hemoglobin: 13.5 g/dL (ref 12.0–15.0)
MCH: 30.6 pg (ref 26.0–34.0)
MCHC: 32.1 g/dL (ref 30.0–36.0)
MCV: 95.2 fL (ref 80.0–100.0)
Platelets: 239 K/uL (ref 150–400)
RBC: 4.41 MIL/uL (ref 3.87–5.11)
RDW: 13 % (ref 11.5–15.5)
WBC: 7.3 K/uL (ref 4.0–10.5)
nRBC: 0 % (ref 0.0–0.2)

## 2023-10-09 LAB — BASIC METABOLIC PANEL WITH GFR
Anion gap: 11 (ref 5–15)
BUN: 16 mg/dL (ref 6–20)
CO2: 28 mmol/L (ref 22–32)
Calcium: 9.4 mg/dL (ref 8.9–10.3)
Chloride: 100 mmol/L (ref 98–111)
Creatinine, Ser: 0.79 mg/dL (ref 0.44–1.00)
GFR, Estimated: >60 mL/min (ref >60)
Glucose, Bld: 61 mg/dL — ABNORMAL LOW (ref 70–99)
Potassium: 4 mmol/L (ref 3.5–5.1)
Sodium: 139 mmol/L (ref 135–145)

## 2023-10-09 LAB — TYPE AND SCREEN
ABO/RH(D): O POS
Antibody Screen: NEGATIVE

## 2023-10-09 LAB — SURGICAL PCR SCREEN
MRSA, PCR: NEGATIVE
Staphylococcus aureus: POSITIVE — AB

## 2023-10-09 NOTE — Progress Notes (Signed)
 PCP - Dr. Camie Moats Cardiologist - Dr. Cara Lovelace LOV 09/10/23 w/clearance  PPM/ICD - Denies Device Orders - n/a Rep Notified - n/a  Chest x-ray - n/a EKG - 07-31-23 CE - requested tracing on 10-09-23. Stress Test - 10-05-21 ECHO - 05-15-21 Cardiac Cath - denies  Sleep Study - denies CPAP - n/a  NON-diabetic  Last dose of GLP1 agonist-  denies GLP1 instructions: n/a  Blood Thinner Instructions: Denies Aspirin Instructions: Per patient last dose on 10-07-23  ERAS Protcol - NPO PRE-SURGERY Ensure or G2- n/a  COVID TEST- n/a   Anesthesia review: Yes, HTN  Patient denies shortness of breath, fever, cough and chest pain at PAT appointment. Patient denies any respiratory issues at this time.    All instructions explained to the patient, with a verbal understanding of the material. Patient agrees to go over the instructions while at home for a better understanding. Patient also instructed to self quarantine after being tested for COVID-19. The opportunity to ask questions was provided.

## 2023-10-10 NOTE — Progress Notes (Signed)
 Anesthesia Chart Review:  Case: [id number] Date/Time: 10/15/23 1145   Procedure: LAMINECTOMY WITH POSTERIOR LATERAL ARTHRODESIS LEVEL 1 (Back) - Posterior lateral fusion - L5-S1, removal of segmental fixation L4-S1   Anesthesia type: General   Diagnosis: Lumbar pseudoarthrosis [S32.009K]   Pre-op diagnosis: Lumbar pseudoarthrosis   [facility]: [facility] ROOM 20 / [facility]   Surgeons: Joshua Alm Hamilton, MD       DISCUSSION: Patient 53 year old female scheduled for the above procedure.   History includes former smoker (2018), HTN, elevated coronary calcium  (CAC 1880, 99th percentile, mild-moderate CAD, no significant stenosis by [facility] 08/21/2023), scoliosis, spinal surgery (L2-5 PLIF 07/10/2016; L4-S1 PLIF 01/17/2020), hysterectomy.  She had recent cardiac testing and follow-up at [facility] by Dr. Florencio. 08/07/2023 TTE showed mild LVH, normal LV systolic function with EF greater than 55%, normal diastolic function, normal RV systolic function, mild MR/TR, trivial PR, RSVP 24 mmHg.  08/21/2023 CCT showed a CAC 1880 (99th percentile), moderate RCA, mild LAD and CX with no significant stenosis by [facility] 08/21/2023.  Zetia was added to her regiment of Crestor .  On losartan  for HTN.  Advise decreasing caffeine intake for palpitations.  In regards to surgery he wrote, Pre-op clearance, clearance provided for back surgery, mild risk.  1 year follow-up planned.  Anesthesia team to evaluate on the day of surgery. Last ASA 10/07/2023.    VS: BP 120/87   Pulse 76   Temp 36.5 C   Resp 16   Ht 5' 7.5 (1.715 m)   Wt 73.8 kg   SpO2 100%   BMI 25.09 kg/m   PROVIDERS: Nicholaus Credit, PA-C is PCP  Florencio Kava, MD is cardiologist    LABS: Labs reviewed: Acceptable for surgery. (all labs ordered are listed, but only abnormal results are displayed)  Labs Reviewed  SURGICAL PCR SCREEN - Abnormal; Notable for the following components:      Result Value   Staphylococcus aureus POSITIVE (*)    All  other components within normal limits  BASIC METABOLIC PANEL WITH GFR - Abnormal; Notable for the following components:   Glucose, Bld 61 (*)    All other components within normal limits  CBC  TYPE AND SCREEN     IMAGES: CT Chest (over read CCTA) 08/21/2023: IMPRESSION: 1. No acute non-cardiac vascular finding. 2. Hypodensity in the central left lobe of the liver is technically too small to accurately characterize but statistically likely benign.    EKG: 07/31/2023 ([facility]; scanned under Media tab): Normal sinus rhythm with sinus arrhythmia  Low voltage QRS  Cannot rule out Anteroseptal infarct , age undetermined  ST and T wave abnormality, consider lateral ischemia  Abnormal ECG  No previous ECGs available  I reviewed and concur with this report. Electronically signed ab:RJOOTNNI MD, DWAYNE ([id number]) on 08/10/2023 6:12:13 PM    CV: CTA Coronary with FFRct 08/21/2023: IMPRESSION: 1. Coronary calcium  score of 1880. This was 99th percentile for age and sex matched controls. 2. Normal coronary origin with right dominance. 3. Moderate stenosis within the proximal RCA (50-69%). 4. Mild stenosis within the LAD and Cx (25-49%). 5. CAD-RADS 3 P4. Moderate non-obstructive CAD (50-69%) with extensive plaque burden. Recommend pharmacologic and nonpharmacologic risk factor modification and cardiology consultation. This study will be sent for FFRct analysis.   FFRct 08/22/23: 1. Left Main:  No significant stenosis. 2. LAD: No significant stenosis. FFRct 0.89 in the proximal-mid portion of the vessel. 3. LCX: No significant stenosis. FFRct 0.94 in the mid portion of the  vessel. 4. RCA: No significant stenosis. FFRct 0.82 in the mid portion of the vessel.   IMPRESSION: 1. CT FFR analysis did not show any significant stenosis.    Echo 08/07/2023 ([facility] CE): NORMAL LEFT VENTRICULAR SYSTOLIC FUNCTION WITH MILD LVH  ESTIMATED EF: >55%  NORMAL LA PRESSURES WITH NORMAL DIASTOLIC FUNCTION  NORMAL  RIGHT VENTRICULAR SYSTOLIC FUNCTION  VALVULAR REGURGITATION: No AR, MILD MR, TRIVIAL PR, MILD TR  ESTIMATED RVSP: 24 mmHg  NO VALVULAR STENOSIS    Long term monitor 05/04/2021 - 05/09/2021: Patient had a min HR of 45 bpm, max HR of 135 bpm, and avg HR of 70 bpm. Predominant underlying rhythm was Sinus Rhythm. Isolated SVEs were rare (<1.0%), SVE Couplets were rare (<1.0%), and SVE Triplets were rare (<1.0%). Isolated VEs were rare (<1.0%),  and no VE Couplets or VE Triplets were present. Ventricular Trigeminy was present.    Summary and conclusions: Multiple triggered events some for PVCs. But overall fairly benign monitor  Past Medical History:  Diagnosis Date   Acute nonintractable headache 01/21/2021   Arthritis    Back pain    Close exposure to COVID-19 virus 01/21/2021   Degeneration of lumbar intervertebral disc 02/27/2016   Displacement of lumbar intervertebral disc without myelopathy 02/27/2016   Hypertension    Idiopathic scoliosis 02/27/2016   Low back pain 02/27/2016   Nail fungus    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   S/P lumbar fusion 01/17/2020   S/P lumbar spinal fusion 07/10/2016    Past Surgical History:  Procedure Laterality Date   ABDOMINAL HYSTERECTOMY     BACK SURGERY     BUNIONECTOMY     RIGHT FOOT   MAXIMUM ACCESS (MAS)POSTERIOR LUMBAR INTERBODY FUSION (PLIF) 3 LEVEL N/A 07/10/2016   Procedure: Lumbar two-Lumbar three - Lumbar three-Lumbar four - Lumbar four-Lumbar five MAXIMUM ACCESS (MAS) POSTERIOR LUMBAR INTERBODY FUSION;  Surgeon: Joshua Alm RAMAN, MD;  Location: [facility] OR;  Service: Neurosurgery;  Laterality: N/A;   TONSILLECTOMY     TUBAL LIGATION      MEDICATIONS:  aspirin EC 81 MG tablet   cyclobenzaprine  (FLEXERIL ) 10 MG tablet   ezetimibe (ZETIA) 10 MG tablet   gabapentin  (NEURONTIN ) 300 MG capsule   LORazepam  (ATIVAN ) 1 MG tablet   losartan  (COZAAR ) 25 MG tablet   meloxicam (MOBIC) 15 MG tablet   methocarbamol  (ROBAXIN ) 500 MG tablet   rosuvastatin   (CRESTOR ) 20 MG tablet   rosuvastatin  (CRESTOR ) 40 MG tablet   No current facility-administered medications for this encounter.    Isaiah Ruder, PA-C Surgical Short Stay/Anesthesiology [facility] Phone [phone] [facility] Phone [phone] 10/10/2023 7:38 PM

## 2023-10-10 NOTE — Anesthesia Preprocedure Evaluation (Addendum)
 Anesthesia Evaluation  Patient identified by MRN, date of birth, ID band Patient awake    Reviewed: Allergy & Precautions, NPO status , Patient's Chart, lab work & pertinent test results  History of Anesthesia Complications Negative for: history of anesthetic complications  Airway Mallampati: II  TM Distance: >3 FB Neck ROM: Full   Comment: Previous grade II view with MAC 4, easy mask Dental  (+) Dental Advisory Given   Pulmonary neg shortness of breath, neg sleep apnea, neg COPD, neg recent URI, former smoker   Pulmonary exam normal breath sounds clear to auscultation       Cardiovascular hypertension (losartan ), Pt. on medications (-) angina + CAD (moderate non-obstructive)  (-) Past MI, (-) Cardiac Stents and (-) CABG + dysrhythmias (palpitations)  Rhythm:Regular Rate:Normal  HLD  CTA Coronary with FFRct 08/21/2023: IMPRESSION: 1. Coronary calcium  score of 1880. This was 99th percentile for age and sex matched controls. 2. Normal coronary origin with right dominance. 3. Moderate stenosis within the proximal RCA (50-69%). 4. Mild stenosis within the LAD and Cx (25-49%). 5. CAD-RADS 3 P4. Moderate non-obstructive CAD (50-69%) with extensive plaque burden. Recommend pharmacologic and nonpharmacologic risk factor modification and cardiology consultation. This study will be sent for FFRct analysis.  FFRct 08/22/23: IMPRESSION: 1. CT FFR analysis did not show any significant stenosis.   Echo 08/07/2023 ([facility]): NORMAL LEFT VENTRICULAR SYSTOLIC FUNCTION WITH MILD LVH  ESTIMATED EF: >55%  NORMAL LA PRESSURES WITH NORMAL DIASTOLIC FUNCTION  NORMAL RIGHT VENTRICULAR SYSTOLIC FUNCTION  VALVULAR REGURGITATION: No AR, MILD MR, TRIVIAL PR, MILD TR  ESTIMATED RVSP: 24 mmHg  NO VALVULAR STENOSIS     Neuro/Psych  Headaches, neg Seizures  Neuromuscular disease (lumbar pseudoarthrosis)    GI/Hepatic negative GI ROS, Neg liver ROS,,,   Endo/Other  negative endocrine ROS    Renal/GU negative Renal ROS     Musculoskeletal  (+) Arthritis ,  Scoliosis    Abdominal   Peds  Hematology negative hematology ROS (+) Lab Results      Component                Value               Date                      WBC                      7.3                 10/09/2023                HGB                      13.5                10/09/2023                HCT                      42.0                10/09/2023                MCV                      95.2  10/09/2023                PLT                      239                 10/09/2023              Anesthesia Other Findings   Reproductive/Obstetrics                              Anesthesia Physical Anesthesia Plan  ASA: 3  Anesthesia Plan: General   Post-op Pain Management: Tylenol  PO (pre-op)*, Ketamine  IV*, Precedex and Dilaudid  IV   Induction: Intravenous  PONV Risk Score and Plan: Ondansetron , Dexamethasone , Midazolam  and Treatment may vary due to age or medical condition  Airway Management Planned: Oral ETT  Additional Equipment:   Intra-op Plan:   Post-operative Plan: Extubation in OR  Informed Consent: I have reviewed the patients History and Physical, chart, labs and discussed the procedure including the risks, benefits and alternatives for the proposed anesthesia with the patient or authorized representative who has indicated his/her understanding and acceptance.     Dental advisory given  Plan Discussed with: CRNA and Anesthesiologist  Anesthesia Plan Comments: (PAT note written 10/10/2023 by Allison Zelenak, PA-C.  Risks of general anesthesia discussed including, but not limited to, sore throat, hoarse voice, chipped/damaged teeth, injury to vocal cords, nausea and vomiting, allergic reactions, lung infection, heart attack, stroke, and death. All questions answered.  )         Anesthesia Quick  Evaluation

## 2023-10-15 ENCOUNTER — Other Ambulatory Visit: Payer: Self-pay

## 2023-10-15 ENCOUNTER — Encounter (HOSPITAL_COMMUNITY): Payer: Self-pay | Admitting: Neurological Surgery

## 2023-10-15 ENCOUNTER — Ambulatory Visit (HOSPITAL_BASED_OUTPATIENT_CLINIC_OR_DEPARTMENT_OTHER): Payer: Worker's Compensation | Admitting: Anesthesiology

## 2023-10-15 ENCOUNTER — Encounter (HOSPITAL_COMMUNITY)
Admission: RE | Disposition: A | Discharge status: Home / Self Care | Payer: Self-pay | Source: Ambulatory Visit | Attending: Neurological Surgery

## 2023-10-15 ENCOUNTER — Ambulatory Visit (HOSPITAL_COMMUNITY)

## 2023-10-15 ENCOUNTER — Ambulatory Visit (HOSPITAL_COMMUNITY): Payer: Worker's Compensation | Admitting: Vascular Surgery

## 2023-10-15 ENCOUNTER — Ambulatory Visit (HOSPITAL_COMMUNITY)
Admission: RE | Admit: 2023-10-15 | Discharge: 2023-10-15 | Disposition: A | Discharge status: Home / Self Care | Payer: Worker's Compensation | Source: Ambulatory Visit

## 2023-10-15 DIAGNOSIS — E785 Hyperlipidemia, unspecified: Secondary | ICD-10-CM | POA: Insufficient documentation

## 2023-10-15 DIAGNOSIS — T84226A Displacement of internal fixation device of vertebrae, initial encounter: Secondary | ICD-10-CM | POA: Diagnosis not present

## 2023-10-15 DIAGNOSIS — M5417 Radiculopathy, lumbosacral region: Secondary | ICD-10-CM | POA: Insufficient documentation

## 2023-10-15 DIAGNOSIS — M419 Scoliosis, unspecified: Secondary | ICD-10-CM | POA: Insufficient documentation

## 2023-10-15 DIAGNOSIS — M1990 Unspecified osteoarthritis, unspecified site: Secondary | ICD-10-CM | POA: Insufficient documentation

## 2023-10-15 DIAGNOSIS — M960 Pseudarthrosis after fusion or arthrodesis: Secondary | ICD-10-CM | POA: Diagnosis not present

## 2023-10-15 DIAGNOSIS — T84296A Other mechanical complication of internal fixation device of vertebrae, initial encounter: Secondary | ICD-10-CM | POA: Diagnosis not present

## 2023-10-15 DIAGNOSIS — Z7982 Long term (current) use of aspirin: Secondary | ICD-10-CM | POA: Diagnosis not present

## 2023-10-15 DIAGNOSIS — I2510 Atherosclerotic heart disease of native coronary artery without angina pectoris: Secondary | ICD-10-CM

## 2023-10-15 DIAGNOSIS — R519 Headache, unspecified: Secondary | ICD-10-CM | POA: Diagnosis not present

## 2023-10-15 DIAGNOSIS — I10 Essential (primary) hypertension: Secondary | ICD-10-CM | POA: Diagnosis not present

## 2023-10-15 DIAGNOSIS — Z981 Arthrodesis status: Secondary | ICD-10-CM | POA: Diagnosis present

## 2023-10-15 DIAGNOSIS — Z87891 Personal history of nicotine dependence: Secondary | ICD-10-CM | POA: Insufficient documentation

## 2023-10-15 DIAGNOSIS — Z79899 Other long term (current) drug therapy: Secondary | ICD-10-CM | POA: Diagnosis not present

## 2023-10-15 DIAGNOSIS — X58XXXA Exposure to other specified factors, initial encounter: Secondary | ICD-10-CM | POA: Diagnosis not present

## 2023-10-15 SURGERY — LAMINECTOMY WITH POSTERIOR LATERAL ARTHRODESIS LEVEL 1
Anesthesia: General | Site: Back

## 2023-10-15 MED ORDER — EPHEDRINE SULFATE-NACL 50-0.9 MG/10ML-% IV SOSY
PREFILLED_SYRINGE | INTRAVENOUS | Status: DC | PRN
  Administered 2023-10-15: 5 mg via INTRAVENOUS
  Administered 2023-10-15: 10 mg via INTRAVENOUS
  Administered 2023-10-15 (×3): 5 mg via INTRAVENOUS

## 2023-10-15 MED ORDER — AMISULPRIDE (ANTIEMETIC) 5 MG/2ML IV SOLN: 10.0000 mg | Freq: Once | INTRAVENOUS | Status: DC | PRN

## 2023-10-15 MED ORDER — MIDAZOLAM HCL 2 MG/2ML IJ SOLN
INTRAMUSCULAR | Status: AC
  Filled 2023-10-15: qty 2

## 2023-10-15 MED ORDER — SURGIRINSE WOUND IRRIGATION SYSTEM - OPTIME
TOPICAL | Status: DC | PRN
  Administered 2023-10-15: 450 mL

## 2023-10-15 MED ORDER — OXYCODONE HCL 5 MG/5ML PO SOLN: 5.0000 mg | Freq: Once | ORAL | Status: AC | PRN

## 2023-10-15 MED ORDER — CHLORHEXIDINE GLUCONATE CLOTH 2 % EX PADS: 6.0000 | MEDICATED_PAD | Freq: Once | CUTANEOUS | Status: DC

## 2023-10-15 MED ORDER — PROPOFOL 10 MG/ML IV BOLUS
INTRAVENOUS | Status: AC
  Filled 2023-10-15: qty 20

## 2023-10-15 MED ORDER — PHENYLEPHRINE 80 MCG/ML (10ML) SYRINGE FOR IV PUSH (FOR BLOOD PRESSURE SUPPORT)
PREFILLED_SYRINGE | INTRAVENOUS | Status: AC
  Filled 2023-10-15: qty 10

## 2023-10-15 MED ORDER — THROMBIN 5000 UNITS EX KIT
PACK | CUTANEOUS | Status: AC
  Filled 2023-10-15: qty 1

## 2023-10-15 MED ORDER — ORAL CARE MOUTH RINSE: 15.0000 mL | Freq: Once | OROMUCOSAL | Status: AC

## 2023-10-15 MED ORDER — DEXAMETHASONE SODIUM PHOSPHATE 10 MG/ML IJ SOLN
INTRAMUSCULAR | Status: AC
  Filled 2023-10-15: qty 1

## 2023-10-15 MED ORDER — LACTATED RINGERS IV SOLN: INTRAVENOUS | Status: DC

## 2023-10-15 MED ORDER — CHLORHEXIDINE GLUCONATE 0.12 % MT SOLN
OROMUCOSAL | Status: AC
  Administered 2023-10-15: 15 mL via OROMUCOSAL
  Filled 2023-10-15: qty 15

## 2023-10-15 MED ORDER — PHENYLEPHRINE HCL-NACL 20-0.9 MG/250ML-% IV SOLN
INTRAVENOUS | Status: DC | PRN
  Administered 2023-10-15: 30 ug/min via INTRAVENOUS

## 2023-10-15 MED ORDER — KETAMINE HCL 50 MG/5ML IJ SOSY
PREFILLED_SYRINGE | INTRAMUSCULAR | Status: AC
  Filled 2023-10-15: qty 5

## 2023-10-15 MED ORDER — OXYCODONE HCL 5 MG PO TABS: 5.0000 mg | ORAL_TABLET | ORAL | 0 refills | Status: AC | PRN

## 2023-10-15 MED ORDER — ACETAMINOPHEN 500 MG PO TABS: 1000.0000 mg | ORAL_TABLET | ORAL | Status: AC

## 2023-10-15 MED ORDER — ACETAMINOPHEN 500 MG PO TABS
ORAL_TABLET | ORAL | Status: AC
  Administered 2023-10-15: 1000 mg via ORAL
  Filled 2023-10-15: qty 2

## 2023-10-15 MED ORDER — MUPIROCIN 2 % EX OINT: 1.0000 | TOPICAL_OINTMENT | Freq: Two times a day (BID) | CUTANEOUS | 0 refills | Status: DC

## 2023-10-15 MED ORDER — CHLORHEXIDINE GLUCONATE 0.12 % MT SOLN: 15.0000 mL | Freq: Once | OROMUCOSAL | Status: AC

## 2023-10-15 MED ORDER — ONDANSETRON HCL 4 MG/2ML IJ SOLN
INTRAMUSCULAR | Status: AC
  Filled 2023-10-15: qty 2

## 2023-10-15 MED ORDER — KETAMINE HCL 50 MG/5ML IJ SOSY
PREFILLED_SYRINGE | INTRAMUSCULAR | Status: DC | PRN
  Administered 2023-10-15: 35 mg via INTRAVENOUS

## 2023-10-15 MED ORDER — OXYCODONE HCL 5 MG PO TABS
5.0000 mg | ORAL_TABLET | Freq: Once | ORAL | Status: AC | PRN
  Administered 2023-10-15: 5 mg via ORAL

## 2023-10-15 MED ORDER — THROMBIN 20000 UNITS EX SOLR
CUTANEOUS | Status: AC
  Filled 2023-10-15: qty 20000

## 2023-10-15 MED ORDER — OXYCODONE HCL 5 MG PO TABS
ORAL_TABLET | ORAL | Status: AC
  Filled 2023-10-15: qty 1

## 2023-10-15 MED ORDER — FENTANYL CITRATE (PF) 250 MCG/5ML IJ SOLN
INTRAMUSCULAR | Status: DC | PRN
  Administered 2023-10-15: 50 ug via INTRAVENOUS
  Administered 2023-10-15: 100 ug via INTRAVENOUS

## 2023-10-15 MED ORDER — FENTANYL CITRATE (PF) 250 MCG/5ML IJ SOLN
INTRAMUSCULAR | Status: AC
  Filled 2023-10-15: qty 5

## 2023-10-15 MED ORDER — MIDAZOLAM HCL 2 MG/2ML IJ SOLN
INTRAMUSCULAR | Status: DC | PRN
  Administered 2023-10-15: 2 mg via INTRAVENOUS

## 2023-10-15 MED ORDER — KETOROLAC TROMETHAMINE 30 MG/ML IJ SOLN
INTRAMUSCULAR | Status: DC | PRN
  Administered 2023-10-15: 30 mg via INTRAVENOUS

## 2023-10-15 MED ORDER — 0.9 % SODIUM CHLORIDE (POUR BTL) OPTIME
TOPICAL | Status: DC | PRN
  Administered 2023-10-15 (×2): 1000 mL

## 2023-10-15 MED ORDER — CHLORHEXIDINE GLUCONATE 4 % EX SOLN: 1.0000 | CUTANEOUS | 1 refills | Status: DC

## 2023-10-15 MED ORDER — LIDOCAINE 2% (20 MG/ML) 5 ML SYRINGE
INTRAMUSCULAR | Status: DC | PRN
Start: 2023-10-15 — End: 2023-10-15
  Administered 2023-10-15: 100 mg via INTRAVENOUS

## 2023-10-15 MED ORDER — ROCURONIUM BROMIDE 10 MG/ML (PF) SYRINGE
PREFILLED_SYRINGE | INTRAVENOUS | Status: DC | PRN
  Administered 2023-10-15: 20 mg via INTRAVENOUS
  Administered 2023-10-15: 50 mg via INTRAVENOUS

## 2023-10-15 MED ORDER — BUPIVACAINE HCL (PF) 0.25 % IJ SOLN
INTRAMUSCULAR | Status: AC
  Filled 2023-10-15: qty 30

## 2023-10-15 MED ORDER — EPHEDRINE 5 MG/ML INJ
INTRAVENOUS | Status: AC
  Filled 2023-10-15: qty 5

## 2023-10-15 MED ORDER — GABAPENTIN 300 MG PO CAPS: 300.0000 mg | ORAL_CAPSULE | ORAL | Status: AC

## 2023-10-15 MED ORDER — DEXAMETHASONE SODIUM PHOSPHATE 10 MG/ML IJ SOLN
INTRAMUSCULAR | Status: DC | PRN
Start: 2023-10-15 — End: 2023-10-15
  Administered 2023-10-15: 10 mg via INTRAVENOUS

## 2023-10-15 MED ORDER — ONDANSETRON HCL 4 MG/2ML IJ SOLN
INTRAMUSCULAR | Status: DC | PRN
  Administered 2023-10-15: 4 mg via INTRAVENOUS

## 2023-10-15 MED ORDER — ROCURONIUM BROMIDE 10 MG/ML (PF) SYRINGE
PREFILLED_SYRINGE | INTRAVENOUS | Status: AC
  Filled 2023-10-15: qty 10

## 2023-10-15 MED ORDER — GABAPENTIN 300 MG PO CAPS
ORAL_CAPSULE | ORAL | Status: AC
  Administered 2023-10-15: 300 mg via ORAL
  Filled 2023-10-15: qty 1

## 2023-10-15 MED ORDER — BUPIVACAINE HCL (PF) 0.25 % IJ SOLN
INTRAMUSCULAR | Status: DC | PRN
  Administered 2023-10-15: 30 mL

## 2023-10-15 MED ORDER — LIDOCAINE 2% (20 MG/ML) 5 ML SYRINGE
INTRAMUSCULAR | Status: AC
  Filled 2023-10-15: qty 5

## 2023-10-15 MED ORDER — SUGAMMADEX SODIUM 200 MG/2ML IV SOLN
INTRAVENOUS | Status: DC | PRN
  Administered 2023-10-15: 200 mg via INTRAVENOUS

## 2023-10-15 MED ORDER — CEFAZOLIN SODIUM-DEXTROSE 2-4 GM/100ML-% IV SOLN
2.0000 g | INTRAVENOUS | Status: AC
  Administered 2023-10-15: 2 g via INTRAVENOUS

## 2023-10-15 MED ORDER — CEFAZOLIN SODIUM-DEXTROSE 2-4 GM/100ML-% IV SOLN
INTRAVENOUS | Status: AC
  Filled 2023-10-15: qty 100

## 2023-10-15 MED ORDER — FENTANYL CITRATE (PF) 100 MCG/2ML IJ SOLN
INTRAMUSCULAR | Status: AC
  Filled 2023-10-15: qty 2

## 2023-10-15 MED ORDER — PROPOFOL 10 MG/ML IV BOLUS
INTRAVENOUS | Status: DC | PRN
Start: 2023-10-15 — End: 2023-10-15
  Administered 2023-10-15: 150 mg via INTRAVENOUS

## 2023-10-15 MED ORDER — FENTANYL CITRATE (PF) 100 MCG/2ML IJ SOLN
25.0000 ug | INTRAMUSCULAR | Status: DC | PRN
  Administered 2023-10-15 (×2): 50 ug via INTRAVENOUS

## 2023-10-15 SURGICAL SUPPLY — 46 items
BAG COUNTER SPONGE SURGICOUNT (BAG) ×1 IMPLANT
BASKET BONE COLLECTION (BASKET) IMPLANT
BENZOIN TINCTURE PRP APPL 2/3 (GAUZE/BANDAGES/DRESSINGS) ×1 IMPLANT
BLADE BONE MILL MEDIUM (MISCELLANEOUS) IMPLANT
BLADE CLIPPER SURG (BLADE) IMPLANT
BONE MATRIX OSTEOCEL PRO LRG (Bone Implant) IMPLANT
BUR CARBIDE MATCH 3.0 (BURR) ×1 IMPLANT
CANISTER SUCTION 3000ML PPV (SUCTIONS) ×1 IMPLANT
CNTNR URN SCR LID CUP LEK RST (MISCELLANEOUS) ×1 IMPLANT
COVER BACK TABLE 60X90IN (DRAPES) ×1 IMPLANT
DRAPE C-ARM 42X72 X-RAY (DRAPES) IMPLANT
DRAPE LAPAROTOMY 100X72X124 (DRAPES) ×1 IMPLANT
DRAPE SURG 17X23 STRL (DRAPES) ×1 IMPLANT
DRSG AQUACEL AG ADV 3.5X 6 (GAUZE/BANDAGES/DRESSINGS) ×1 IMPLANT
DURAPREP 26ML APPLICATOR (WOUND CARE) ×1 IMPLANT
ELECTRODE REM PT RTRN 9FT ADLT (ELECTROSURGICAL) ×1 IMPLANT
EVACUATOR 1/8 PVC DRAIN (DRAIN) IMPLANT
GAUZE 4X4 16PLY ~~LOC~~+RFID DBL (SPONGE) IMPLANT
GLOVE BIO SURGEON STRL SZ7 (GLOVE) ×1 IMPLANT
GLOVE BIO SURGEON STRL SZ8 (GLOVE) ×2 IMPLANT
GLOVE BIOGEL PI IND STRL 7.0 (GLOVE) ×1 IMPLANT
GOWN STRL REUS W/ TWL LRG LVL3 (GOWN DISPOSABLE) IMPLANT
GOWN STRL REUS W/ TWL XL LVL3 (GOWN DISPOSABLE) ×2 IMPLANT
GOWN STRL REUS W/TWL 2XL LVL3 (GOWN DISPOSABLE) IMPLANT
GRAFT BN 5X1XSPNE CVD POST DBM (Bone Implant) IMPLANT
HEMOSTAT POWDER KIT SURGIFOAM (HEMOSTASIS) ×1 IMPLANT
KIT BASIN OR (CUSTOM PROCEDURE TRAY) ×1 IMPLANT
KIT TURNOVER KIT B (KITS) ×1 IMPLANT
MILL BONE PREP (MISCELLANEOUS) IMPLANT
NDL HYPO 25X1 1.5 SAFETY (NEEDLE) ×1 IMPLANT
NEEDLE HYPO 25X1 1.5 SAFETY (NEEDLE) ×1 IMPLANT
PACK LAMINECTOMY NEURO (CUSTOM PROCEDURE TRAY) ×1 IMPLANT
PAD ARMBOARD POSITIONER FOAM (MISCELLANEOUS) ×3 IMPLANT
SOLN 0.9% NACL 1000 ML (IV SOLUTION) ×1 IMPLANT
SOLN 0.9% NACL POUR BTL 1000ML (IV SOLUTION) ×1 IMPLANT
SOLN STERILE WATER 1000 ML (IV SOLUTION) ×1 IMPLANT
SOLN STERILE WATER BTL 1000 ML (IV SOLUTION) ×1 IMPLANT
SOLUTION IRRIG SURGIPHOR (IV SOLUTION) ×1 IMPLANT
SPONGE SURGIFOAM ABS GEL 100 (HEMOSTASIS) ×1 IMPLANT
SPONGE T-LAP 4X18 ~~LOC~~+RFID (SPONGE) IMPLANT
STRIP CLOSURE SKIN 1/2X4 (GAUZE/BANDAGES/DRESSINGS) ×2 IMPLANT
SUT VIC AB 0 CT1 18XCR BRD8 (SUTURE) ×1 IMPLANT
SUT VIC AB 2-0 CP2 18 (SUTURE) ×1 IMPLANT
SUT VIC AB 3-0 SH 8-18 (SUTURE) ×2 IMPLANT
TOWEL GREEN STERILE (TOWEL DISPOSABLE) ×1 IMPLANT
TOWEL GREEN STERILE FF (TOWEL DISPOSABLE) ×1 IMPLANT

## 2023-10-15 NOTE — Anesthesia Postprocedure Evaluation (Signed)
 Anesthesia Post Note  Patient: Tammy [name]  Procedure(s) Performed: POSTERIOR LATERAL FUSION LUMBAR FIVE-SACRAL ONE, REMOVAL OF SEQMENTAL FIXATION LUMBAR FOUR-SACRAL ONE (Back)     Patient location during evaluation: PACU Anesthesia Type: General Level of consciousness: awake Pain management: pain level controlled Vital Signs Assessment: post-procedure vital signs reviewed and stable Respiratory status: spontaneous breathing, nonlabored ventilation and respiratory function stable Cardiovascular status: blood pressure returned to baseline and stable Postop Assessment: no apparent nausea or vomiting Anesthetic complications: no   No notable events documented.  Last Vitals:  Vitals:   10/15/23 1330 10/15/23 1345  BP: 138/89 (!) 144/88  Pulse: 75 78  Resp: 12 12  Temp:  36.4 C  SpO2: 92% 99%    Last Pain:  Vitals:   10/15/23 1345  TempSrc:   PainSc: 0-No pain                 Tammy [name]

## 2023-10-15 NOTE — Discharge Summary (Signed)
 Physician Discharge Summary  Patient ID: Tammy [name] MRN: [id number] DOB/AGE: October 01, 1970 53 y.o.  Admit date: 10/15/2023 Discharge date: 10/15/2023  Admission Diagnoses: Pseudoarthrosis L5-S1 with lucency around instrumentation    Discharge Diagnoses: Same   Discharged Condition: Good  Hospital Course: The patient was admitted on 10/15/2023 and taken to the operating room where the patient underwent lumbar reexploration with extraction of instrumentation and redo fusion L5-S1. The patient tolerated the procedure well and was taken to the recovery room and then to the PACU in stable condition. The hospital course was routine. There were no complications. The wound remained clean dry and intact. Pt had appropriate back soreness. No complaints of leg pain or new N/T/W. The patient remained afebrile with stable vital signs, and tolerated a regular diet. The patient continued to increase activities, and pain was well controlled with oral pain medications.   Consults: None  Significant Diagnostic Studies:  Results for orders placed or performed during the hospital encounter of 10/09/23  Type and screen [facility]   Collection Time: 10/09/23  9:20 AM  Result Value Ref Range   ABO/RH(D) O POS    Antibody Screen NEG    Sample Expiration 10/23/2023,2359    Extend sample reason      NO TRANSFUSIONS OR PREGNANCY IN THE PAST 3 MONTHS Performed at [facility], [address], [place], [place] [address]   CBC per protocol   Collection Time: 10/09/23  9:30 AM  Result Value Ref Range   WBC 7.3 4.0 - 10.5 K/uL   RBC 4.41 3.87 - 5.11 MIL/uL   Hemoglobin 13.5 12.0 - 15.0 g/dL   HCT 57.9 63.9 - 53.9 %   MCV 95.2 80.0 - 100.0 fL   MCH 30.6 26.0 - 34.0 pg   MCHC 32.1 30.0 - 36.0 g/dL   RDW 86.9 88.4 - 84.4 %   Platelets 239 150 - 400 K/uL   nRBC 0.0 0.0 - 0.2 %  Basic metabolic panel per protocol   Collection Time: 10/09/23  9:30 AM  Result Value Ref Range    Sodium 139 135 - 145 mmol/L   Potassium 4.0 3.5 - 5.1 mmol/L   Chloride 100 98 - 111 mmol/L   CO2 28 22 - 32 mmol/L   Glucose, Bld 61 (L) 70 - 99 mg/dL   BUN 16 6 - 20 mg/dL   Creatinine, Ser 9.20 0.44 - 1.00 mg/dL   Calcium  9.4 8.9 - 10.3 mg/dL   GFR, Estimated >39 >39 mL/min   Anion gap 11 5 - 15  Surgical pcr screen   Collection Time: 10/09/23 10:47 AM   Specimen: Nasal Mucosa; Nasal Swab  Result Value Ref Range   MRSA, PCR NEGATIVE NEGATIVE   Staphylococcus aureus POSITIVE (A) NEGATIVE    No results found.  Antibiotics:  Anti-infectives (From admission, onward)    Start     Dose/Rate Route Frequency Ordered Stop   10/15/23 1030  ceFAZolin  (ANCEF ) IVPB 2g/100 mL premix        2 g 200 mL/hr over 30 Minutes Intravenous On call to O.R. 10/15/23 1026 10/15/23 1145   10/15/23 1029  ceFAZolin  (ANCEF ) 2-4 GM/100ML-% IVPB       Note to Pharmacy: Edsel Leita HERO: cabinet override      10/15/23 1029 10/15/23 1151       Discharge Exam: Blood pressure 134/84, pulse 71, temperature 97.8 F (36.6 C), temperature source Oral, resp. rate 19, height 5' 7 (1.702 m), weight  72.6 kg, SpO2 100%. Neurologic: Grossly normal Dressing dry  Discharge Medications:   Allergies as of 10/15/2023       Reactions   Hydrocodone Itching, Nausea And Vomiting        Medication List     TAKE these medications    aspirin EC 81 MG tablet Take 81 mg by mouth daily. Swallow whole.   chlorhexidine  4 % external liquid Commonly known as: HIBICLENS  Apply 15 mLs (1 Application total) topically as directed for 30 doses. Use as directed daily for 5 days every other week for 6 weeks.   cyclobenzaprine  10 MG tablet Commonly known as: FLEXERIL  TAKE ONE TABLET BY MOUTH 3 TIMES DAILY FOR MUSCLE SPASMS What changed:  how much to take how to take this when to take this additional instructions   ezetimibe 10 MG tablet Commonly known as: ZETIA Take 10 mg by mouth daily.   gabapentin  300 MG  capsule Commonly known as: NEURONTIN  Take 300 mg by mouth daily as needed (pain).   LORazepam  1 MG tablet Commonly known as: ATIVAN  TAKE 1 TABLET BY MOUTH AT BEDTIME.   losartan  25 MG tablet Commonly known as: COZAAR  Take 1 tablet (25 mg total) by mouth daily.   meloxicam 15 MG tablet Commonly known as: MOBIC Take 15 mg by mouth daily as needed for pain.   methocarbamol  500 MG tablet Commonly known as: ROBAXIN  Take 500 mg by mouth daily as needed for muscle spasms.   mupirocin  ointment 2 % Commonly known as: BACTROBAN  Place 1 Application into the nose 2 (two) times daily for 60 doses. Use as directed 2 times daily for 5 days every other week for 6 weeks.   oxyCODONE  5 MG immediate release tablet Commonly known as: Oxy IR/ROXICODONE  Take 1 tablet (5 mg total) by mouth every 4 (four) hours as needed (for pain score of 1-4).   rosuvastatin  20 MG tablet Commonly known as: CRESTOR  Take 1 tablet (20 mg total) by mouth daily.   rosuvastatin  40 MG tablet Commonly known as: CRESTOR  Take 40 mg by mouth daily.               Discharge Care Instructions  (From admission, onward)           Start     Ordered   10/15/23 0000  Discharge wound care:       Comments: Remove wound dressing in 1 week.  May shower normally.   10/15/23 1244            Disposition: Home   Final Dx: Lumbar reexploration with extraction of loosened instrumentation L4-S1 with redo fusion L5-S1  Discharge Instructions     Call MD for:  difficulty breathing, headache or visual disturbances   Complete by: As directed    Call MD for:  persistant nausea and vomiting   Complete by: As directed    Call MD for:  redness, tenderness, or signs of infection (pain, swelling, redness, odor or green/yellow discharge around incision site)   Complete by: As directed    Call MD for:  severe uncontrolled pain   Complete by: As directed    Call MD for:  temperature >100.4   Complete by: As directed     Diet - low sodium heart healthy   Complete by: As directed    Discharge wound care:   Complete by: As directed    Remove wound dressing in 1 week.  May shower normally.   Increase activity slowly   Complete  by: As directed           Signed: Alm GORMAN Molt 10/15/2023, 12:44 PM

## 2023-10-15 NOTE — Op Note (Signed)
 10/15/2023  12:38 PM  PATIENT:  Tammy [name]  53 y.o. female  PRE-OPERATIVE DIAGNOSIS:  1.  Loosened instrumentation L4-S1 2.  Suspected pseudoarthrosis L5-S1, 3.  Back pain with leg pain  POST-OPERATIVE DIAGNOSIS:  same  PROCEDURE: Lumbar reexploration with extraction of segmental instrumentation L4-S1 followed by posterolateral arthrodesis L5-S1 utilizing morselized allograft, with exploration of fusion L5-S1 to confirm pseudoarthrosis  SURGEON:  Alm Molt, MD  ASSISTANTS: Suzen Pean, FNP  ANESTHESIA:   General  EBL: 20 ml  Total I/O In: 100 [IV Piggyback:100] Out: -   BLOOD ADMINISTERED: none  DRAINS: None  SPECIMEN:  none  INDICATION FOR PROCEDURE: This patient presented with back pain and leg pain. Imaging showed probable pseudoarthrosis L5-S1 with loosening of instrumentation at S1. The patient tried conservative measures without relief. Pain was debilitating. Recommended removal of loosened instrumentation followed by exploration of fusion and redo fusion L5-S1. Patient understood the risks, benefits, and alternatives and potential outcomes and wished to proceed.  PROCEDURE DETAILS: The patient was taken to the operating room and after induction of adequate generalized endotracheal anesthesia, the patient was rolled into the prone position on chest rolls and all pressure points were padded. The lumbar region was cleaned with Betadine scrub and then prepped with DuraPrep and draped in the usual sterile fashion. 5 cc of local anesthesia was injected and then a dorsal midline incision was made and carried down to the lumbo sacral fascia. The fascia was opened and the paraspinous musculature was taken down in a subperiosteal fashion to expose the previously placed instrumentation from L4-S1.  The locking caps were removed and the rods were removed.  The screws at S1 were loose.  The old fusion was explored and there was no bridging bone between L5 and S1 posterior  laterally.  We removed the L4-L5 and S1 pedicle screws.  We irrigated with 0.5% povidone and iodine solution.  I irrigated with saline solution containing bacitracin .  We then dissected out over the transverse processes of L5 and the sacral ala and decorticated these with a high-speed drill.  We then placed morselized allograft here bilaterally to perform arthrodesis L5-S1.  Closed the fascia with 0 Vicryl. I closed the subcutaneous tissues with 2-0 Vicryl and the subcuticular tissues with 3-0 Vicryl. The skin was then closed with benzoin and Steri-Strips. The drapes were removed, a sterile dressing was applied.  My nurse practitioner was involved in the exposure, safe retraction of the neural elements, the disc work and the closure. the patient was awakened from general anesthesia and transferred to the recovery room in stable condition. At the end of the procedure all sponge, needle and instrument counts were correct.    PLAN OF CARE: Discharge to home after PACU  PATIENT DISPOSITION:  PACU - hemodynamically stable.   Delay start of Pharmacological VTE agent (>24hrs) due to surgical blood loss or risk of bleeding:  yes

## 2023-10-15 NOTE — Transfer of Care (Signed)
 Immediate Anesthesia Transfer of Care Note  Patient: Tammy [name]  Procedure(s) Performed: POSTERIOR LATERAL FUSION LUMBAR FIVE-SACRAL ONE, REMOVAL OF SEQMENTAL FIXATION LUMBAR FOUR-SACRAL ONE (Back)  Patient Location: PACU  Anesthesia Type:General  Level of Consciousness: awake, alert , patient cooperative, and responds to stimulation  Airway & Oxygen Therapy: Patient Spontanous Breathing and Patient connected to face mask oxygen  Post-op Assessment: Report given to RN, Post -op Vital signs reviewed and stable, and Patient moving all extremities X 4  Post vital signs: Reviewed and stable  Last Vitals:  Vitals Value Taken Time  BP 127/76 10/15/23 12:50  Temp    Pulse 89 10/15/23 12:52  Resp 22 10/15/23 12:52  SpO2 96 % 10/15/23 12:52  Vitals shown include unfiled device data.  Last Pain:  Vitals:   10/15/23 1038  TempSrc:   PainSc: 5          Complications: No notable events documented.

## 2023-10-15 NOTE — Anesthesia Procedure Notes (Signed)
 Procedure Name: Intubation Date/Time: 10/15/2023 11:35 AM  Performed by: Jolynn Mage, CRNAPre-anesthesia Checklist: Patient identified, Patient being monitored, Timeout performed, Emergency Drugs available and Suction available Patient Re-evaluated:Patient Re-evaluated prior to induction Oxygen Delivery Method: Circle System Utilized Preoxygenation: Pre-oxygenation with 100% oxygen Induction Type: IV induction Ventilation: Mask ventilation without difficulty Laryngoscope Size: Miller and 2 Grade View: Grade I Tube type: Oral Tube size: 7.0 mm Number of attempts: 1 Airway Equipment and Method: Stylet Placement Confirmation: ETT inserted through vocal cords under direct vision, positive ETCO2 and breath sounds checked- equal and bilateral Secured at: 21 cm Tube secured with: Tape Dental Injury: Teeth and Oropharynx as per pre-operative assessment

## 2023-10-15 NOTE — H&P (Signed)
 Subjective: Patient is a 53 y.o. female admitted for back pain. Onset of symptoms was several months ago, gradually worsening since that time.  The pain is rated moderate, and is located at the across the lower back and radiates to hips. The pain is described as aching and occurs all day. The symptoms have been progressive. Symptoms are exacerbated by exercise, standing, and walking for more than a few minutes. 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 screws   Past Medical History:  Diagnosis Date   Acute nonintractable headache 01/21/2021   Arthritis    Back pain    Close exposure to COVID-19 virus 01/21/2021   Degeneration of lumbar intervertebral disc 02/27/2016   Displacement of lumbar intervertebral disc without myelopathy 02/27/2016   Hypertension    Idiopathic scoliosis 02/27/2016   Low back pain 02/27/2016   Nail fungus    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   S/P lumbar fusion 01/17/2020   S/P lumbar spinal fusion 07/10/2016    Past Surgical History:  Procedure Laterality Date   ABDOMINAL HYSTERECTOMY     BACK SURGERY     BUNIONECTOMY     RIGHT FOOT   MAXIMUM ACCESS (MAS)POSTERIOR LUMBAR INTERBODY FUSION (PLIF) 3 LEVEL N/A 07/10/2016   Procedure: Lumbar two-Lumbar three - Lumbar three-Lumbar four - Lumbar four-Lumbar five MAXIMUM ACCESS (MAS) POSTERIOR LUMBAR INTERBODY FUSION;  Surgeon: Joshua Alm RAMAN, MD;  Location: [facility] OR;  Service: Neurosurgery;  Laterality: N/A;   TONSILLECTOMY     TUBAL LIGATION      Prior to Admission medications   Medication Sig Start Date End Date Taking? Authorizing Provider  aspirin EC 81 MG tablet Take 81 mg by mouth daily. Swallow whole.   Yes [provider]  cyclobenzaprine  (FLEXERIL ) 10 MG tablet TAKE ONE TABLET BY MOUTH 3 TIMES DAILY FOR MUSCLE SPASMS Patient taking differently: Take 10 mg by mouth at bedtime. 05/05/23  Yes Nicholaus Credit, PA-C  ezetimibe (ZETIA) 10 MG tablet Take 10 mg by mouth daily. 09/10/23 09/09/24 Yes [provider]  gabapentin  (NEURONTIN ) 300 MG capsule Take 300 mg by mouth daily as needed (pain).   Yes [provider]  LORazepam  (ATIVAN ) 1 MG tablet TAKE 1 TABLET BY MOUTH AT BEDTIME. 09/10/23  Yes Nicholaus Credit, PA-C  losartan  (COZAAR ) 25 MG tablet Take 1 tablet (25 mg total) by mouth daily. 08/19/23  Yes Nicholaus Credit, PA-C  meloxicam (MOBIC) 15 MG tablet Take 15 mg by mouth daily as needed for pain.   Yes [provider]  methocarbamol  (ROBAXIN ) 500 MG tablet Take 500 mg by mouth daily as needed for muscle spasms. 07/16/23  Yes [provider]  rosuvastatin  (CRESTOR ) 20 MG tablet Take 1 tablet (20 mg total) by mouth daily. 07/21/23  Yes Nicholaus Credit, PA-C  rosuvastatin  (CRESTOR ) 40 MG tablet Take 40 mg by mouth daily. 09/10/23   [provider]   Allergies  Allergen Reactions   Hydrocodone Itching and Nausea And Vomiting    Social History   Tobacco Use   Smoking status: Former    Current packs/day: 0.00    Average packs/day: 1 pack/day for 25.0 years (25.0 ttl pk-yrs)    Types: Cigarettes    Start date: 77    Quit date: 2018    Years since quitting: 7.7   Smokeless tobacco: Never  Substance Use Topics   Alcohol use: Yes    Alcohol/week: 10.0 standard drinks of alcohol    Types: 10 Cans of beer per week  Comment: SOCIAL    Family History  Problem Relation Age of Onset   Stroke Mother    Hypertension Mother    Ovarian cancer Mother    Hypercholesterolemia Father    Heart murmur Father    Heart disease Father    Hypertension Sister    Hypercholesterolemia Sister    Stroke Sister      Review of Systems  Positive ROS: neg  All other systems have been reviewed and were otherwise negative with the exception of those mentioned in the HPI and as above.  Objective: Vital signs in last 24 hours: Temp:  [97.8 F (36.6 C)] 97.8 F (36.6 C) (09/24 1009) Pulse Rate:  [71] 71 (09/24 1009) Resp:  [19] 19 (09/24 1009) BP: (134)/(84) 134/84  (09/24 1009) SpO2:  [100 %] 100 % (09/24 1009) Weight:  [72.6 kg] 72.6 kg (09/24 1009)  General Appearance: Alert, cooperative, no distress, appears stated age Head: Normocephalic, without obvious abnormality, atraumatic Eyes: PERRL, conjunctiva/corneas clear, EOM's intact    Neck: Supple, symmetrical, trachea midline Back: Symmetric, no curvature, ROM normal, no CVA tenderness Lungs:  respirations unlabored Heart: Regular rate and rhythm Abdomen: Soft, non-tender Extremities: Extremities normal, atraumatic, no cyanosis or edema Pulses: 2+ and symmetric all extremities Skin: Skin color, texture, turgor normal, no rashes or lesions  NEUROLOGIC:   Mental status: Alert and oriented x4,  no aphasia, good attention span, fund of knowledge, and memory Motor Exam - grossly normal Sensory Exam - grossly normal Reflexes: 1+ Coordination - grossly normal Gait - grossly normal Balance - grossly normal Cranial Nerves: I: smell Not tested  II: visual acuity  OS: nl    OD: nl  II: visual fields Full to confrontation  II: pupils Equal, round, reactive to light  III,VII: ptosis None  III,IV,VI: extraocular muscles  Full ROM  V: mastication Normal  V: facial light touch sensation  Normal  V,VII: corneal reflex  Present  VII: facial muscle function - upper  Normal  VII: facial muscle function - lower Normal  VIII: hearing Not tested  IX: soft palate elevation  Normal  IX,X: gag reflex Present  XI: trapezius strength  5/5  XI: sternocleidomastoid strength 5/5  XI: neck flexion strength  5/5  XII: tongue strength  Normal    Data Review Lab Results  Component Value Date   WBC 7.3 10/09/2023   HGB 13.5 10/09/2023   HCT 42.0 10/09/2023   MCV 95.2 10/09/2023   PLT 239 10/09/2023   Lab Results  Component Value Date   NA 139 10/09/2023   K 4.0 10/09/2023   CL 100 10/09/2023   CO2 28 10/09/2023   BUN 16 10/09/2023   CREATININE 0.79 10/09/2023   GLUCOSE 61 (L) 10/09/2023   Lab  Results  Component Value Date   INR 0.9 01/13/2020    Assessment/Plan:  Estimated body mass index is 25.06 kg/m as calculated from the following:   Height as of this encounter: 5' 7 (1.702 m).   Weight as of this encounter: 72.6 kg. Patient admitted for removal of instrumentation L4-S1, re-do posterolateral fusion L5-S1. Patient has failed a reasonable attempt at conservative therapy.  I explained the condition and procedure to the patient and answered any questions.  Patient wishes to proceed with procedure as planned. Understands risks/ benefits and typical outcomes of procedure.   Alm GORMAN Molt 10/15/2023 10:48 AM

## 2023-10-16 ENCOUNTER — Encounter (HOSPITAL_COMMUNITY): Payer: Self-pay | Admitting: Neurological Surgery

## 2023-10-19 ENCOUNTER — Ambulatory Visit (HOSPITAL_BASED_OUTPATIENT_CLINIC_OR_DEPARTMENT_OTHER)
Admission: EM | Admit: 2023-10-19 | Discharge: 2023-10-19 | Disposition: A | Discharge status: Home / Self Care | Attending: Family Medicine | Admitting: Family Medicine

## 2023-10-19 ENCOUNTER — Telehealth (HOSPITAL_BASED_OUTPATIENT_CLINIC_OR_DEPARTMENT_OTHER): Payer: Self-pay | Admitting: Family Medicine

## 2023-10-19 ENCOUNTER — Encounter (HOSPITAL_BASED_OUTPATIENT_CLINIC_OR_DEPARTMENT_OTHER): Payer: Self-pay

## 2023-10-19 DIAGNOSIS — R051 Acute cough: Secondary | ICD-10-CM

## 2023-10-19 LAB — POC SOFIA SARS ANTIGEN FIA: SARS Coronavirus 2 Ag: NEGATIVE

## 2023-10-19 MED ORDER — LIDOCAINE VISCOUS HCL 2 % MT SOLN: 15.0000 mL | Freq: Four times a day (QID) | OROMUCOSAL | 0 refills | Status: DC | PRN

## 2023-10-19 MED ORDER — AMOXICILLIN 875 MG PO TABS: 875.0000 mg | ORAL_TABLET | Freq: Two times a day (BID) | ORAL | 0 refills | Status: DC

## 2023-10-19 MED ORDER — LIDOCAINE VISCOUS HCL 2 % MT SOLN: 15.0000 mL | Freq: Four times a day (QID) | OROMUCOSAL | 0 refills | Status: AC | PRN

## 2023-10-19 NOTE — ED Provider Notes (Signed)
 [facility]    CSN: [id number] Arrival date & time: 10/19/23  0959      History   Chief Complaint Chief Complaint  Patient presents with   Cough   Sore Throat   Fever    HPI Tammy [name] is a 53 y.o. female.   Pt is a 53 year old female that presents with cough, chest congestion.  Symptoms been constant worsening over the past 3 days.  She did have recent back surgery approximate 1 week ago.  Started feeling bad the day after that.  Her son has also been sick.  She has been taking over-the-counter medications for symptoms.  She did test positive for staph in her nasal patches on surgery today.  She has been using cream to treat this.   Cough Associated symptoms: fever   Sore Throat  Fever Associated symptoms: cough     Past Medical History:  Diagnosis Date   Acute nonintractable headache 01/21/2021   Arthritis    Back pain    Close exposure to COVID-19 virus 01/21/2021   Degeneration of lumbar intervertebral disc 02/27/2016   Displacement of lumbar intervertebral disc without myelopathy 02/27/2016   Hypertension    Idiopathic scoliosis 02/27/2016   Low back pain 02/27/2016   Nail fungus    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   S/P lumbar fusion 01/17/2020   S/P lumbar spinal fusion 07/10/2016    Patient Active Problem List   Diagnosis Date Noted   Elevated ferritin 12/03/2022   Bruising tendency 12/03/2022   Sacroiliac joint pain 11/12/2022   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   Benign hypertension 01/28/2022   Needs flu shot 01/28/2022   Mixed hyperlipidemia 01/28/2022   Palpitations 05/04/2021   Dyslipidemia 05/04/2021   Dyspnea on exertion 05/04/2021   Acute nonintractable headache 01/21/2021   Close exposure to COVID-19 virus 01/21/2021   S/P lumbar fusion 01/17/2020   S/P lumbar spinal fusion 07/10/2016   Degeneration of lumbar intervertebral disc 02/27/2016   Displacement of lumbar intervertebral disc without myelopathy 02/27/2016    Idiopathic scoliosis 02/27/2016   Low back pain 02/27/2016   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    Past Surgical History:  Procedure Laterality Date   ABDOMINAL HYSTERECTOMY     BACK SURGERY     BUNIONECTOMY     RIGHT FOOT   LAMINECTOMY WITH POSTERIOR LATERAL ARTHRODESIS LEVEL 1 N/A 10/15/2023   Procedure: POSTERIOR LATERAL FUSION LUMBAR FIVE-SACRAL ONE, REMOVAL OF SEQMENTAL FIXATION LUMBAR FOUR-SACRAL ONE;  Surgeon: Joshua Alm Hamilton, MD;  Location: [facility] OR;  Service: Neurosurgery;  Laterality: N/A;  Posterior lateral fusion - L5-S1, removal of segmental fixation L4-S1   MAXIMUM ACCESS (MAS)POSTERIOR LUMBAR INTERBODY FUSION (PLIF) 3 LEVEL N/A 07/10/2016   Procedure: Lumbar two-Lumbar three - Lumbar three-Lumbar four - Lumbar four-Lumbar five MAXIMUM ACCESS (MAS) POSTERIOR LUMBAR INTERBODY FUSION;  Surgeon: Joshua Alm RAMAN, MD;  Location: [facility] OR;  Service: Neurosurgery;  Laterality: N/A;   TONSILLECTOMY     TUBAL LIGATION      OB History   No obstetric history on file.      Home Medications    Prior to Admission medications   Medication Sig Start Date End Date Taking? Authorizing Provider  amoxicillin  (AMOXIL ) 875 MG tablet Take 1 tablet (875 mg total) by mouth 2 (two) times daily for 7 days. 10/19/23 10/26/23  Adah Corning A, FNP  aspirin EC 81 MG tablet Take 81 mg by mouth daily. Swallow whole.    [provider]  benzonatate (TESSALON) 100  MG capsule Take 1 capsule (100 mg total) by mouth 3 (three) times daily as needed for cough. 10/21/23   Gladis Elsie BROCKS, PA-C  chlorhexidine  (HIBICLENS ) 4 % external liquid Apply 15 mLs (1 Application total) topically as directed for 30 doses. Use as directed daily for 5 days every other week for 6 weeks. 10/15/23   Joshua Alm Hamilton, MD  cyclobenzaprine  (FLEXERIL ) 10 MG tablet TAKE ONE TABLET BY MOUTH 3 TIMES DAILY FOR MUSCLE SPASMS Patient taking differently: Take 10 mg by mouth at bedtime. 05/05/23   Nicholaus Credit, PA-C  ezetimibe (ZETIA) 10 MG  tablet Take 10 mg by mouth daily. 09/10/23 09/09/24  [provider]  gabapentin  (NEURONTIN ) 300 MG capsule Take 300 mg by mouth daily as needed (pain).    [provider]  ipratropium (ATROVENT) 0.03 % nasal spray Place 2 sprays into both nostrils every 12 (twelve) hours. 10/21/23   Gladis Elsie BROCKS, PA-C  lidocaine  (XYLOCAINE ) 2 % solution Use as directed 15 mLs in the mouth or throat every 6 (six) hours as needed for mouth pain. 10/19/23   Adah Corning A, FNP  LORazepam  (ATIVAN ) 1 MG tablet TAKE 1 TABLET BY MOUTH AT BEDTIME. 09/10/23   Nicholaus Credit, PA-C  losartan  (COZAAR ) 25 MG tablet Take 1 tablet (25 mg total) by mouth daily. 08/19/23   Nicholaus Credit, PA-C  meloxicam (MOBIC) 15 MG tablet Take 15 mg by mouth daily as needed for pain.    [provider]  methocarbamol  (ROBAXIN ) 500 MG tablet Take 500 mg by mouth daily as needed for muscle spasms. 07/16/23   [provider]  mupirocin  ointment (BACTROBAN ) 2 % Place 1 Application into the nose 2 (two) times daily for 60 doses. Use as directed 2 times daily for 5 days every other week for 6 weeks. 10/15/23 11/14/23  Joshua Alm Hamilton, MD  oxyCODONE  (OXY IR/ROXICODONE ) 5 MG immediate release tablet Take 1 tablet (5 mg total) by mouth every 4 (four) hours as needed (for pain score of 1-4). 10/15/23   Joshua Alm Hamilton, MD  rosuvastatin  (CRESTOR ) 20 MG tablet Take 1 tablet (20 mg total) by mouth daily. 07/21/23   Nicholaus Credit, PA-C  rosuvastatin  (CRESTOR ) 40 MG tablet Take 40 mg by mouth daily. 09/10/23   [provider]    Family History Family History  Problem Relation Age of Onset   Stroke Mother    Hypertension Mother    Ovarian cancer Mother    Hypercholesterolemia Father    Heart murmur Father    Heart disease Father    Hypertension Sister    Hypercholesterolemia Sister    Stroke Sister     Social History Social History   Tobacco Use   Smoking status: Former    Current packs/day: 0.00    Average  packs/day: 1 pack/day for 25.0 years (25.0 ttl pk-yrs)    Types: Cigarettes    Start date: 4    Quit date: 2018    Years since quitting: 7.7   Smokeless tobacco: Never  Vaping Use   Vaping status: Every Day   Substances: Nicotine  Substance Use Topics   Alcohol use: Yes    Alcohol/week: 10.0 standard drinks of alcohol    Types: 10 Cans of beer per week    Comment: SOCIAL   Drug use: No     Allergies   Hydrocodone   Review of Systems Review of Systems  Constitutional:  Positive for fever.  Respiratory:  Positive for cough.  Physical Exam Triage Vital Signs ED Triage Vitals  Encounter Vitals Group     BP 10/19/23 1021 122/78     Girls Systolic BP Percentile --      Girls Diastolic BP Percentile --      Boys Systolic BP Percentile --      Boys Diastolic BP Percentile --      Pulse Rate 10/19/23 1021 87     Resp 10/19/23 1021 18     Temp 10/19/23 1021 98.4 F (36.9 C)     Temp Source 10/19/23 1021 Oral     SpO2 10/19/23 1021 98 %     Weight --      Height --      Head Circumference --      Peak Flow --      Pain Score 10/19/23 1018 9     Pain Loc --      Pain Education --      Exclude from Growth Chart --    No data found.  Updated Vital Signs BP 122/78 (BP Location: Right Arm)   Pulse 87   Temp 98.4 F (36.9 C) (Oral)   Resp 18   SpO2 98%   Visual Acuity Right Eye Distance:   Left Eye Distance:   Bilateral Distance:    Right Eye Near:   Left Eye Near:    Bilateral Near:     Physical Exam Constitutional:      General: She is not in acute distress.    Appearance: Normal appearance. She is ill-appearing. She is not toxic-appearing or diaphoretic.  HENT:     Head: Normocephalic and atraumatic.     Right Ear: Tympanic membrane and ear canal normal.     Left Ear: Tympanic membrane and ear canal normal.     Nose: Congestion and rhinorrhea present.     Mouth/Throat:     Pharynx: Oropharynx is clear.  Eyes:     Conjunctiva/sclera:  Conjunctivae normal.  Cardiovascular:     Rate and Rhythm: Normal rate and regular rhythm.     Pulses: Normal pulses.     Heart sounds: Normal heart sounds.  Pulmonary:     Effort: Pulmonary effort is normal.     Breath sounds: Rhonchi present.  Skin:    General: Skin is warm and dry.  Neurological:     Mental Status: She is alert.  Psychiatric:        Mood and Affect: Mood normal.      UC Treatments / Results  Labs (all labs ordered are listed, but only abnormal results are displayed) Labs Reviewed  POC SOFIA SARS ANTIGEN FIA - Normal    EKG   Radiology No results found.  Procedures Procedures (including critical care time)  Medications Ordered in UC Medications - No data to display  Initial Impression / Assessment and Plan / UC Course  I have reviewed the triage vital signs and the nursing notes.  Pertinent labs & imaging results that were available during my care of the patient were reviewed by me and considered in my medical decision making (see chart for details).     Acute cough-patient ill-appearing on exam.  Covid test negative   Will go ahead and treat for respiratory infection with antibiotics at this time.  She can take over-the-counter medications like Mucinex, ibuprofen and Tylenol  for symptoms as needed.  Lidocaine  prescribed to help with sore throat. Follow-up as needed Final Clinical Impressions(s) / UC Diagnoses   Final diagnoses:  Acute cough     Discharge Instructions      Treating you for respiratory infection.  Take the antibiotics as prescribed.  You can do the lidocaine  as needed before eating or drinking. Ibuprofen and Tylenol  for pain as needed.  Follow-up as needed    ED Prescriptions     Medication Sig Dispense Auth. Provider   amoxicillin  (AMOXIL ) 875 MG tablet Take 1 tablet (875 mg total) by mouth 2 (two) times daily for 7 days. 14 tablet Florian Chauca A, FNP   lidocaine  (XYLOCAINE ) 2 % solution Use as directed 15 mLs in  the mouth or throat every 6 (six) hours as needed for mouth pain. 100 mL Adah Corning A, FNP      PDMP not reviewed this encounter.   Adah Corning LABOR, FNP 10/21/23 1050

## 2023-10-19 NOTE — ED Triage Notes (Addendum)
 Really bad cold Fever for three days Throat is raw Deep chest cough Back surgery on Wed and started to feel bad on Thursday  Son has been sick Has been taking OTC medication   Was positive for Staphylococcus aureus 9/18

## 2023-10-19 NOTE — Discharge Instructions (Signed)
 Treating you for respiratory infection.  Take the antibiotics as prescribed.  You can do the lidocaine  as needed before eating or drinking. Ibuprofen and Tylenol  for pain as needed.  Follow-up as needed

## 2023-10-19 NOTE — Telephone Encounter (Signed)
 Sending scrip to new pharmacy

## 2023-10-20 ENCOUNTER — Telehealth: Admitting: Physician Assistant

## 2023-10-20 ENCOUNTER — Ambulatory Visit (HOSPITAL_COMMUNITY): Admit: 2023-10-20 | Admitting: Neurological Surgery

## 2023-10-20 DIAGNOSIS — J069 Acute upper respiratory infection, unspecified: Secondary | ICD-10-CM

## 2023-10-20 SURGERY — LAMINECTOMY WITH POSTERIOR LATERAL ARTHRODESIS LEVEL 1
Anesthesia: General | Site: Back

## 2023-10-21 MED ORDER — BENZONATATE 100 MG PO CAPS: 100.0000 mg | ORAL_CAPSULE | Freq: Three times a day (TID) | ORAL | 0 refills | Status: DC | PRN

## 2023-10-21 MED ORDER — IPRATROPIUM BROMIDE 0.03 % NA SOLN: 2.0000 | Freq: Two times a day (BID) | NASAL | 0 refills | Status: DC

## 2023-10-21 NOTE — Progress Notes (Signed)

## 2023-10-21 NOTE — Progress Notes (Signed)
 I have spent 5 minutes in review of e-visit questionnaire, review and updating patient chart, medical decision making and response to patient.   Elsie Velma Lunger, PA-C

## 2023-10-22 ENCOUNTER — Ambulatory Visit: Admitting: Family Medicine

## 2023-10-22 ENCOUNTER — Encounter: Payer: Self-pay | Admitting: Family Medicine

## 2023-10-22 ENCOUNTER — Ambulatory Visit: Payer: Self-pay

## 2023-10-22 VITALS — BP 140/80 | HR 82 | Temp 98.1°F | Resp 18 | Ht 67.0 in | Wt 163.0 lb

## 2023-10-22 DIAGNOSIS — J208 Acute bronchitis due to other specified organisms: Secondary | ICD-10-CM | POA: Diagnosis not present

## 2023-10-22 DIAGNOSIS — J0110 Acute frontal sinusitis, unspecified: Secondary | ICD-10-CM | POA: Diagnosis not present

## 2023-10-22 MED ORDER — AZITHROMYCIN 250 MG PO TABS: ORAL_TABLET | ORAL | 0 refills | Status: DC

## 2023-10-22 MED ORDER — PREDNISONE 10 MG PO TABS: ORAL_TABLET | ORAL | 0 refills | Status: AC

## 2023-10-22 MED ORDER — CEFTRIAXONE SODIUM 1 G IJ SOLR
1.0000 g | Freq: Once | INTRAMUSCULAR | Status: AC
  Administered 2023-10-22: 1 g via INTRAMUSCULAR

## 2023-10-22 NOTE — Assessment & Plan Note (Addendum)
-   Prescribe prednisone. - Prescribe azithromycin  (Z-Pak). - Administer rocephin  injection.  Orders:   azithromycin  (ZITHROMAX ) 250 MG tablet; 2 DAILY FOR FIRST DAY, THEN DECREASE TO ONE DAILY FOR 4 MORE DAYS.   predniSONE (DELTASONE) 10 MG tablet; Take 6 tablets (60 mg total) by mouth daily with breakfast for 1 day, THEN 5 tablets (50 mg total) daily with breakfast for 1 day, THEN 4 tablets (40 mg total) daily with breakfast for 1 day, THEN 3 tablets (30 mg total) daily with breakfast for 1 day, THEN 2 tablets (20 mg total) daily with breakfast for 1 day, THEN 1 tablet (10 mg total) daily with breakfast for 1 day.   cefTRIAXone  (ROCEPHIN ) injection 1 g

## 2023-10-22 NOTE — Telephone Encounter (Signed)
 FYI Only or Action Required?: Action required by provider: clinical question for provider.  Patient was last seen in primary care on 05/05/2023 by Nicholaus Credit, PA-C.  Called Nurse Triage reporting No chief complaint on file..  Symptoms began several days ago.  Interventions attempted: OTC medications: tylenol , mucinex.  Symptoms are: unchanged.  Triage Disposition: See Physician Within 24 Hours  Patient/caregiver understands and will follow disposition?: Yes  Copied from CRM #[id number]. Topic: Clinical - Red Word Triage >> Oct 22, 2023  9:46 AM Tammy [name] wrote: Red Word that prompted transfer to Nurse Triage: pt stated she went to urgent care and was prescribed a medication but she is not getting better. Pt is also is in pain due to her back surgery. Pt has a respiratory infection Reason for Disposition  SEVERE coughing spells (e.g., whooping sound after coughing, vomiting after coughing)    Productive cough, taking antibiotics  Answer Assessment - Initial Assessment Questions No available appts with pcp, scheduled 10/22/23. Patient requesting CALL BACK and alternative antibiotic and steroid. Patient had back surgery last Wednesday 9/24 and prefers not to come in for visit.  Advised call back or UC/ED if symptoms worsen.  1. ONSET: When did the cough begin?      Thursday, Sunday went UC, not getting better, taking antibitic. I don't feel worse, but I dont' feel any better 2. SEVERITY: How bad is the cough today?      Mild moderate 3. SPUTUM: Describe the color of your sputum (e.g., none, dry cough; clear, white, yellow, green)     greenish 4. HEMOPTYSIS: Are you coughing up any blood? If Yes, ask: How much? (e.g., flecks, streaks, tablespoons, etc.)     none 5. DIFFICULTY BREATHING: Are you having difficulty breathing? If Yes, ask: How bad is it? (e.g., mild, moderate, severe)      none 6. FEVER: Do you have a fever? If Yes, ask: What is your temperature, how was it  measured, and when did it start?     Denies fever, 99.6. Reports chills; Tylenol , Mucinex, Amoxicillin (not missed any doses) 7. CARDIAC HISTORY: Do you have any history of heart disease? (e.g., heart attack, congestive heart failure)      no 8. LUNG HISTORY: Do you have any history of lung disease?  (e.g., pulmonary embolus, asthma, emphysema)     no 9. PE RISK FACTORS: Do you have a history of blood clots? (or: recent major surgery, recent prolonged travel, bedridden)     no 10. OTHER SYMPTOMS: Do you have any other symptoms? (e.g., runny nose, wheezing, chest pain)       Nasal congestion, productive cough, HA, weakness denies sore throat, dizziness, chest pain  12. TRAVEL: Have you traveled out of the country in the last month? (e.g., travel history, exposures)       no  Protocols used: Cough - Acute Productive-A-AH

## 2023-10-22 NOTE — Progress Notes (Signed)
 Acute Office Visit  Subjective:    Patient ID: Tammy [name], female    DOB: [birth year], 53 y.o.   MRN: [id number]  Chief Complaint  Patient presents with   Cough   Sinusitis     Discussed the use of AI scribe software for clinical note transcription with the patient, who gave verbal consent to proceed.  History of Present Illness Tammy [name] is a 53 year old female who presents with respiratory symptoms following recent back surgery.  Postoperative status - Underwent back surgery on October 15, 2023, for removal of loose and broken screws from prior spinal fusion (2021) - Initially experienced no back pain postoperatively, only incision soreness  Acute respiratory symptoms - Onset of symptoms on October 16, 2023 - Severe sore throat with sensation of blisters, partially relieved by lidocaine  - Cough productive of 'chunky green' sputum - Chills and body aches - Persistent headache - Nasal congestion - Ears feel stopped up but clear with nose blowing - No ear pain - History of bronchitis and upper respiratory infections during childhood  Recent interventions and medications - Visited urgent care on October 19, 2023; tested for COVID-19 and prescribed amoxicillin  - Has taken two doses of prednisone, last dose on morning of visit - Using Tylenol  and Mucinex since October 16, 2023 - Received lidocaine  for throat pain with some relief       Past Medical History:  Diagnosis Date   Acute nonintractable headache 01/21/2021   Arthritis    Back pain    Close exposure to COVID-19 virus 01/21/2021   Degeneration of lumbar intervertebral disc 02/27/2016   Displacement of lumbar intervertebral disc without myelopathy 02/27/2016   Hypertension    Idiopathic scoliosis 02/27/2016   Low back pain 02/27/2016   Nail fungus    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   S/P lumbar fusion 01/17/2020   S/P lumbar spinal fusion 07/10/2016    Past Surgical History:   Procedure Laterality Date   ABDOMINAL HYSTERECTOMY     BACK SURGERY     BUNIONECTOMY     RIGHT FOOT   LAMINECTOMY WITH POSTERIOR LATERAL ARTHRODESIS LEVEL 1 N/A 10/15/2023   Procedure: POSTERIOR LATERAL FUSION LUMBAR FIVE-SACRAL ONE, REMOVAL OF SEQMENTAL FIXATION LUMBAR FOUR-SACRAL ONE;  Surgeon: Joshua Alm Hamilton, MD;  Location: [facility] OR;  Service: Neurosurgery;  Laterality: N/A;  Posterior lateral fusion - L5-S1, removal of segmental fixation L4-S1   MAXIMUM ACCESS (MAS)POSTERIOR LUMBAR INTERBODY FUSION (PLIF) 3 LEVEL N/A 07/10/2016   Procedure: Lumbar two-Lumbar three - Lumbar three-Lumbar four - Lumbar four-Lumbar five MAXIMUM ACCESS (MAS) POSTERIOR LUMBAR INTERBODY FUSION;  Surgeon: Joshua Alm RAMAN, MD;  Location: [facility] OR;  Service: Neurosurgery;  Laterality: N/A;   SPINE SURGERY     TONSILLECTOMY     TUBAL LIGATION      Family History  Problem Relation Age of Onset   Stroke Mother    Hypertension Mother    Ovarian cancer Mother    Arthritis Mother    Varicose Veins Mother    Hypercholesterolemia Father    Heart murmur Father    Heart disease Father    Hypertension Sister    Hypercholesterolemia Sister    Stroke Sister     Social History   Socioeconomic History   Marital status: Married    Spouse name: Not on file   Number of children: Not on file   Years of education: Not on file   Highest education level: Some college, no degree  Occupational History  Not on file  Tobacco Use   Smoking status: Former    Current packs/day: 0.00    Average packs/day: 1 pack/day for 25.0 years (25.0 ttl pk-yrs)    Types: Cigarettes    Start date: 44    Quit date: 2018    Years since quitting: 7.7   Smokeless tobacco: Never  Vaping Use   Vaping status: Every Day   Substances: Nicotine  Substance and Sexual Activity   Alcohol use: Yes    Alcohol/week: 10.0 standard drinks of alcohol    Types: 10 Cans of beer per week    Comment: SOCIAL   Drug use: No   Sexual activity: Yes     Birth control/protection: Surgical  Other Topics Concern   Not on file  Social History Narrative   Not on file   Social Drivers of Health   Financial Resource Strain: Low Risk  (07/31/2023)   Received from [facility]   Overall Financial Resource Strain (CARDIA)    Difficulty of Paying Living Expenses: Not very hard  Food Insecurity: No Food Insecurity (07/31/2023)   Received from [facility]   Hunger Vital Sign    Within the past 12 months, you worried that your food would run out before you got the money to buy more.: Never true    Within the past 12 months, the food you bought just didn't last and you didn't have money to get more.: Never true  Transportation Needs: No Transportation Needs (07/31/2023)   Received from [facility] - Transportation    In the past 12 months, has lack of transportation kept you from medical appointments or from getting medications?: No    Lack of Transportation (Non-Medical): No  Physical Activity: Insufficiently Active (05/01/2023)   Exercise Vital Sign    Days of Exercise per Week: 2 days    Minutes of Exercise per Session: 30 min  Stress: No Stress Concern Present (05/01/2023)   [employer] of Occupational Health - Occupational Stress Questionnaire    Feeling of Stress : Not at all  Social Connections: Moderately Integrated (05/01/2023)   Social Connection and Isolation Panel    Frequency of Communication with Friends and Family: More than three times a week    Frequency of Social Gatherings with Friends and Family: Once a week    Attends Religious Services: 1 to 4 times per year    Active Member of [employer] or Organizations: No    Attends Engineer, structural: Not on file    Marital Status: Married  Catering manager Violence: Not on file    Outpatient Medications Prior to Visit  Medication Sig Dispense Refill   aspirin EC 81 MG tablet Take 81 mg by mouth daily.  Swallow whole.     benzonatate (TESSALON) 100 MG capsule Take 1 capsule (100 mg total) by mouth 3 (three) times daily as needed for cough. 30 capsule 0   cyclobenzaprine  (FLEXERIL ) 10 MG tablet TAKE ONE TABLET BY MOUTH 3 TIMES DAILY FOR MUSCLE SPASMS 90 tablet 1   ezetimibe (ZETIA) 10 MG tablet Take 10 mg by mouth daily.     gabapentin  (NEURONTIN ) 300 MG capsule Take 300 mg by mouth daily as needed (pain).     lidocaine  (XYLOCAINE ) 2 % solution Use as directed 15 mLs in the mouth or throat every 6 (six) hours as needed for mouth pain. 100 mL 0   LORazepam  (ATIVAN ) 1 MG tablet TAKE 1  TABLET BY MOUTH AT BEDTIME. 30 tablet 1   losartan  (COZAAR ) 25 MG tablet Take 1 tablet (25 mg total) by mouth daily. 90 tablet 0   meloxicam (MOBIC) 15 MG tablet Take 15 mg by mouth daily as needed for pain.     methocarbamol  (ROBAXIN ) 500 MG tablet Take 500 mg by mouth daily as needed for muscle spasms.     mupirocin  ointment (BACTROBAN ) 2 % Place 1 Application into the nose 2 (two) times daily for 60 doses. Use as directed 2 times daily for 5 days every other week for 6 weeks. 60 g 0   oxyCODONE  (OXY IR/ROXICODONE ) 5 MG immediate release tablet Take 1 tablet (5 mg total) by mouth every 4 (four) hours as needed (for pain score of 1-4). 30 tablet 0   rosuvastatin  (CRESTOR ) 40 MG tablet Take 40 mg by mouth daily.     amoxicillin  (AMOXIL ) 875 MG tablet Take 1 tablet (875 mg total) by mouth 2 (two) times daily for 7 days. 14 tablet 0   chlorhexidine  (HIBICLENS ) 4 % external liquid Apply 15 mLs (1 Application total) topically as directed for 30 doses. Use as directed daily for 5 days every other week for 6 weeks. 946 mL 1   rosuvastatin  (CRESTOR ) 20 MG tablet Take 1 tablet (20 mg total) by mouth daily. 90 tablet 0   ipratropium (ATROVENT) 0.03 % nasal spray Place 2 sprays into both nostrils every 12 (twelve) hours. (Patient not taking: Reported on 10/22/2023) 30 mL 0   No facility-administered medications prior to visit.     Allergies  Allergen Reactions   Hydrocodone Itching and Nausea And Vomiting    Review of Systems  Constitutional:  Positive for chills, fatigue and fever. Negative for diaphoresis.  HENT:  Positive for congestion. Negative for ear pain and sinus pain.   Eyes: Negative.   Respiratory:  Positive for cough. Negative for shortness of breath.   Cardiovascular:  Negative for chest pain.  Gastrointestinal:  Negative for abdominal pain, constipation, nausea and vomiting.  Endocrine: Negative.   Genitourinary:  Negative for dysuria.  Musculoskeletal:  Negative for arthralgias.  Allergic/Immunologic: Negative.   Neurological:  Positive for headaches. Negative for dizziness, weakness and light-headedness.  Psychiatric/Behavioral:  Negative for dysphoric mood. The patient is not nervous/anxious.        Objective:        10/22/2023    2:25 PM 10/19/2023   10:21 AM 10/15/2023    1:45 PM  Vitals with BMI  Height 5' 7    Weight 163 lbs    BMI 25.52    Systolic 140 122 855  Diastolic 80 78 88  Pulse 82 87 78    No data found.   Physical Exam Vitals reviewed.  Constitutional:      Appearance: Normal appearance. She is ill-appearing.  HENT:     Right Ear: Tympanic membrane, ear canal and external ear normal.     Left Ear: Tympanic membrane, ear canal and external ear normal.     Nose: Congestion present.     Right Turbinates: Enlarged and swollen.     Left Turbinates: Enlarged and swollen.     Right Sinus: Frontal sinus tenderness present.     Left Sinus: Frontal sinus tenderness present.     Mouth/Throat:     Pharynx: Oropharynx is clear.  Cardiovascular:     Rate and Rhythm: Normal rate and regular rhythm.     Heart sounds: Normal heart sounds. No murmur heard. Pulmonary:  Effort: Pulmonary effort is normal. No respiratory distress.     Breath sounds: Normal breath sounds.  Lymphadenopathy:     Cervical: No cervical adenopathy.  Skin:    Comments: Vertical  incision c/d/I.  Dressing removed.   Neurological:     Mental Status: She is alert and oriented to person, place, and time.  Psychiatric:        Mood and Affect: Mood normal.        Behavior: Behavior normal.     Health Maintenance Due  Topic Date Due   Hepatitis B Vaccines 19-59 Average Risk (1 of 3 - 19+ 3-dose series) Never done   Pneumococcal Vaccine: 50+ Years (1 of 1 - PCV) Never done   Influenza Vaccine  08/22/2023       Topic Date Due   Hepatitis B Vaccines 19-59 Average Risk (1 of 3 - 19+ 3-dose series) Never done     Lab Results  Component Value Date   TSH 1.010 05/05/2023   Lab Results  Component Value Date   WBC 7.3 10/09/2023   HGB 13.5 10/09/2023   HCT 42.0 10/09/2023   MCV 95.2 10/09/2023   PLT 239 10/09/2023   Lab Results  Component Value Date   NA 139 10/09/2023   K 4.0 10/09/2023   CO2 28 10/09/2023   GLUCOSE 61 (L) 10/09/2023   BUN 16 10/09/2023   CREATININE 0.79 10/09/2023   BILITOT 0.5 05/05/2023   ALKPHOS 58 05/05/2023   AST 17 05/05/2023   ALT 19 05/05/2023   PROT 6.7 05/05/2023   ALBUMIN  4.6 05/05/2023   CALCIUM  9.4 10/09/2023   ANIONGAP 11 10/09/2023   EGFR 89 05/05/2023   Lab Results  Component Value Date   CHOL 184 05/05/2023   Lab Results  Component Value Date   HDL 75 05/05/2023   Lab Results  Component Value Date   LDLCALC 95 05/05/2023   Lab Results  Component Value Date   TRIG 77 05/05/2023   Lab Results  Component Value Date   CHOLHDL 2.5 05/05/2023   No results found for: HGBA1C      Results for orders placed or performed during the hospital encounter of 10/19/23  POC SARS Coronavirus 2 Ag   Collection Time: 10/19/23 10:35 AM  Result Value Ref Range   SARS Coronavirus 2 Ag Negative Negative     Assessment & Plan:   Assessment & Plan Acute non-recurrent frontal sinusitis Acute upper respiratory infection with productive cough. - Prescribe prednisone. - Prescribe azithromycin  (Z-Pak). -  Administer rocephin  injection. Orders:   azithromycin  (ZITHROMAX ) 250 MG tablet; 2 DAILY FOR FIRST DAY, THEN DECREASE TO ONE DAILY FOR 4 MORE DAYS.   predniSONE (DELTASONE) 10 MG tablet; Take 6 tablets (60 mg total) by mouth daily with breakfast for 1 day, THEN 5 tablets (50 mg total) daily with breakfast for 1 day, THEN 4 tablets (40 mg total) daily with breakfast for 1 day, THEN 3 tablets (30 mg total) daily with breakfast for 1 day, THEN 2 tablets (20 mg total) daily with breakfast for 1 day, THEN 1 tablet (10 mg total) daily with breakfast for 1 day.   cefTRIAXone  (ROCEPHIN ) injection 1 g  Acute bronchitis due to other specified organisms - Prescribe prednisone. - Prescribe azithromycin  (Z-Pak). - Administer rocephin  injection.  Orders:   azithromycin  (ZITHROMAX ) 250 MG tablet; 2 DAILY FOR FIRST DAY, THEN DECREASE TO ONE DAILY FOR 4 MORE DAYS.   predniSONE (DELTASONE) 10 MG tablet; Take 6 tablets (  60 mg total) by mouth daily with breakfast for 1 day, THEN 5 tablets (50 mg total) daily with breakfast for 1 day, THEN 4 tablets (40 mg total) daily with breakfast for 1 day, THEN 3 tablets (30 mg total) daily with breakfast for 1 day, THEN 2 tablets (20 mg total) daily with breakfast for 1 day, THEN 1 tablet (10 mg total) daily with breakfast for 1 day.   cefTRIAXone  (ROCEPHIN ) injection 1 g     Body mass index is 25.53 kg/m..  Meds ordered this encounter  Medications   azithromycin  (ZITHROMAX ) 250 MG tablet    Sig: 2 DAILY FOR FIRST DAY, THEN DECREASE TO ONE DAILY FOR 4 MORE DAYS.    Dispense:  6 tablet    Refill:  0   predniSONE (DELTASONE) 10 MG tablet    Sig: Take 6 tablets (60 mg total) by mouth daily with breakfast for 1 day, THEN 5 tablets (50 mg total) daily with breakfast for 1 day, THEN 4 tablets (40 mg total) daily with breakfast for 1 day, THEN 3 tablets (30 mg total) daily with breakfast for 1 day, THEN 2 tablets (20 mg total) daily with breakfast for 1 day, THEN 1 tablet (10  mg total) daily with breakfast for 1 day.    Dispense:  21 tablet    Refill:  0   cefTRIAXone  (ROCEPHIN ) injection 1 g    No orders of the defined types were placed in this encounter.    Follow-up: Return if symptoms worsen or fail to improve.  An After Visit Summary was printed and given to the patient.  Abigail Free, MD [facility] [phone]

## 2023-10-22 NOTE — Assessment & Plan Note (Addendum)
 Acute upper respiratory infection with productive cough. - Prescribe prednisone. - Prescribe azithromycin  (Z-Pak). - Administer rocephin  injection. Orders:   azithromycin  (ZITHROMAX ) 250 MG tablet; 2 DAILY FOR FIRST DAY, THEN DECREASE TO ONE DAILY FOR 4 MORE DAYS.   predniSONE (DELTASONE) 10 MG tablet; Take 6 tablets (60 mg total) by mouth daily with breakfast for 1 day, THEN 5 tablets (50 mg total) daily with breakfast for 1 day, THEN 4 tablets (40 mg total) daily with breakfast for 1 day, THEN 3 tablets (30 mg total) daily with breakfast for 1 day, THEN 2 tablets (20 mg total) daily with breakfast for 1 day, THEN 1 tablet (10 mg total) daily with breakfast for 1 day.   cefTRIAXone  (ROCEPHIN ) injection 1 g

## 2023-10-22 NOTE — Patient Instructions (Addendum)
  VISIT SUMMARY: You visited today due to respiratory symptoms following your recent back surgery. You have a severe sore throat, productive cough, chills, body aches, headache, nasal congestion, and a sensation of blocked ears. You were previously seen at urgent care and started on amoxicillin  and prednisone.  YOUR PLAN: ACUTE UPPER RESPIRATORY INFECTION WITH PRODUCTIVE COUGH: You have an upper respiratory infection with a productive cough. -Continue taking prednisone as prescribed. -Start taking azithromycin  (Z-Pak) as prescribed. -You received a rocephin  injection today. -May stop amoxicillin .   RECENT LUMBAR SPINE SURGERY: You recently had back surgery to remove loose and broken screws from a prior spinal fusion. -Your surgical dressing was removed today. -Wound looks good.   LOTS OF REST, FLUIDS, AND KEEP EATING SO YOU HAVE THE ENERGY YOU NEED TO GET BETTER!                       Contains text generated by Abridge.                                 Contains text generated by Abridge.

## 2023-10-30 ENCOUNTER — Other Ambulatory Visit: Payer: Self-pay | Admitting: Physician Assistant

## 2023-10-30 DIAGNOSIS — G8929 Other chronic pain: Secondary | ICD-10-CM

## 2023-11-06 ENCOUNTER — Ambulatory Visit (INDEPENDENT_AMBULATORY_CARE_PROVIDER_SITE_OTHER): Admitting: Physician Assistant

## 2023-11-06 ENCOUNTER — Encounter: Payer: Self-pay | Admitting: Physician Assistant

## 2023-11-06 VITALS — BP 122/82 | HR 79 | Temp 98.2°F | Ht 67.0 in | Wt 163.2 lb

## 2023-11-06 DIAGNOSIS — R7989 Other specified abnormal findings of blood chemistry: Secondary | ICD-10-CM

## 2023-11-06 DIAGNOSIS — R3121 Asymptomatic microscopic hematuria: Secondary | ICD-10-CM

## 2023-11-06 DIAGNOSIS — Z720 Tobacco use: Secondary | ICD-10-CM

## 2023-11-06 DIAGNOSIS — R739 Hyperglycemia, unspecified: Secondary | ICD-10-CM | POA: Diagnosis not present

## 2023-11-06 DIAGNOSIS — Z23 Encounter for immunization: Secondary | ICD-10-CM

## 2023-11-06 DIAGNOSIS — M545 Low back pain, unspecified: Secondary | ICD-10-CM

## 2023-11-06 DIAGNOSIS — F419 Anxiety disorder, unspecified: Secondary | ICD-10-CM

## 2023-11-06 DIAGNOSIS — E782 Mixed hyperlipidemia: Secondary | ICD-10-CM | POA: Diagnosis not present

## 2023-11-06 DIAGNOSIS — I2510 Atherosclerotic heart disease of native coronary artery without angina pectoris: Secondary | ICD-10-CM

## 2023-11-06 DIAGNOSIS — I10 Essential (primary) hypertension: Secondary | ICD-10-CM | POA: Diagnosis not present

## 2023-11-06 DIAGNOSIS — G8929 Other chronic pain: Secondary | ICD-10-CM

## 2023-11-06 LAB — POCT URINALYSIS DIP (CLINITEK)
Bilirubin, UA: NEGATIVE
Glucose, UA: NEGATIVE mg/dL
Ketones, POC UA: NEGATIVE mg/dL
Leukocytes, UA: NEGATIVE
Nitrite, UA: NEGATIVE
Spec Grav, UA: 1.025 (ref 1.010–1.025)
Urobilinogen, UA: 0.2 U/dL
pH, UA: 6 (ref 5.0–8.0)

## 2023-11-06 LAB — URINALYSIS, ROUTINE W REFLEX MICROSCOPIC
Bilirubin, UA: NEGATIVE
Glucose, UA: NEGATIVE
Ketones, UA: NEGATIVE
Leukocytes,UA: NEGATIVE
Nitrite, UA: NEGATIVE
Protein,UA: NEGATIVE
RBC, UA: NEGATIVE
Specific Gravity, UA: 1.026 (ref 1.005–1.030)
Urobilinogen, Ur: 1 mg/dL (ref 0.2–1.0)
pH, UA: 5.5 (ref 5.0–7.5)

## 2023-11-06 MED ORDER — VENLAFAXINE HCL ER 37.5 MG PO CP24: 37.5000 mg | ORAL_CAPSULE | Freq: Every day | ORAL | 0 refills | Status: DC

## 2023-11-06 MED ORDER — LORAZEPAM 1 MG PO TABS: 1.0000 mg | ORAL_TABLET | Freq: Every day | ORAL | 1 refills | Status: AC

## 2023-11-06 MED ORDER — CYCLOBENZAPRINE HCL 10 MG PO TABS
ORAL_TABLET | ORAL | 1 refills | Status: AC
Start: 2023-11-06 — End: ?

## 2023-11-06 NOTE — Progress Notes (Deleted)
 Subjective:  Patient ID: Tammy [name], female    DOB: 10/11/1970  Age: 53 y.o. MRN: [id number]  Chief Complaint  Patient presents with  . Medical Management of Chronic Issues    HPI      11/06/2023    9:01 AM 05/05/2023    3:01 PM 11/20/2022   11:04 AM 01/28/2022   10:05 AM 04/09/2021    2:10 PM  Depression screen PHQ 2/9  Decreased Interest 0 0 0 0 0  Down, Depressed, Hopeless 0 0 0 0 0  PHQ - 2 Score 0 0 0 0 0  Altered sleeping   0 0   Tired, decreased energy   0 0   Change in appetite   0 0   Feeling bad or failure about yourself    0 0   Trouble concentrating   0 0   Moving slowly or fidgety/restless   0 0   Suicidal thoughts   0 0   PHQ-9 Score   0 0   Difficult doing work/chores   Not difficult at all Not difficult at all         01/17/2020    9:00 PM 01/28/2022   10:05 AM 11/20/2022   11:04 AM 05/05/2023    3:01 PM 11/06/2023    8:59 AM  Fall Risk  Falls in the past year?  1 0 0 0  Was there an injury with Fall?  0 0 0 0  Fall Risk Category Calculator  1 0 0 0  Fall Risk Category (Retired)  Low      (RETIRED) Patient Fall Risk Level Moderate fall risk  Low fall risk      Patient at Risk for Falls Due to  History of fall(s) No Fall Risks No Fall Risks No Fall Risks  Fall risk Follow up  Falls evaluation completed  Falls evaluation completed  Falls evaluation completed     Data saved with a previous flowsheet row definition     ROS CONSTITUTIONAL: Negative for chills, fatigue, fever, unintentional weight gain and unintentional weight loss.  E/N/T: Negative for ear pain, nasal congestion and sore throat.  CARDIOVASCULAR: Negative for chest pain, dizziness, palpitations and pedal edema.  RESPIRATORY: Negative for recent cough and dyspnea.  GASTROINTESTINAL: Negative for abdominal pain, acid reflux symptoms, constipation, diarrhea, nausea and vomiting.  MSK: Negative for arthralgias and myalgias.  INTEGUMENTARY: Negative for rash.  NEUROLOGICAL:  Negative for dizziness and headaches.  PSYCHIATRIC: Negative for sleep disturbance and to question depression screen.  Negative for depression, negative for anhedonia.    Current Outpatient Medications:  .  aspirin EC 81 MG tablet, Take 81 mg by mouth daily. Swallow whole., Disp: , Rfl:  .  benzonatate (TESSALON) 100 MG capsule, Take 1 capsule (100 mg total) by mouth 3 (three) times daily as needed for cough., Disp: 30 capsule, Rfl: 0 .  cyclobenzaprine  (FLEXERIL ) 10 MG tablet, TAKE ONE TABLET BY MOUTH 3 TIMES DAILY FOR MUSCLE SPASMS, Disp: 90 tablet, Rfl: 1 .  ezetimibe (ZETIA) 10 MG tablet, Take 10 mg by mouth daily., Disp: , Rfl:  .  lidocaine  (XYLOCAINE ) 2 % solution, Use as directed 15 mLs in the mouth or throat every 6 (six) hours as needed for mouth pain., Disp: 100 mL, Rfl: 0 .  LORazepam  (ATIVAN ) 1 MG tablet, TAKE 1 TABLET BY MOUTH AT BEDTIME., Disp: 30 tablet, Rfl: 1 .  losartan  (COZAAR ) 25 MG tablet, Take 1 tablet (25 mg total) by mouth daily.,  Disp: 90 tablet, Rfl: 0 .  meloxicam (MOBIC) 15 MG tablet, Take 15 mg by mouth daily as needed for pain., Disp: , Rfl:  .  methocarbamol  (ROBAXIN ) 500 MG tablet, Take 500 mg by mouth daily as needed for muscle spasms., Disp: , Rfl:  .  oxyCODONE  (OXY IR/ROXICODONE ) 5 MG immediate release tablet, Take 1 tablet (5 mg total) by mouth every 4 (four) hours as needed (for pain score of 1-4)., Disp: 30 tablet, Rfl: 0 .  rosuvastatin  (CRESTOR ) 40 MG tablet, Take 40 mg by mouth daily., Disp: , Rfl:  .  azithromycin  (ZITHROMAX ) 250 MG tablet, 2 DAILY FOR FIRST DAY, THEN DECREASE TO ONE DAILY FOR 4 MORE DAYS. (Patient not taking: Reported on 11/06/2023), Disp: 6 tablet, Rfl: 0 .  gabapentin  (NEURONTIN ) 300 MG capsule, Take 300 mg by mouth daily as needed (pain). (Patient not taking: Reported on 11/06/2023), Disp: , Rfl:  .  ipratropium (ATROVENT) 0.03 % nasal spray, Place 2 sprays into both nostrils every 12 (twelve) hours. (Patient not taking: Reported on  11/06/2023), Disp: 30 mL, Rfl: 0 .  mupirocin  ointment (BACTROBAN ) 2 %, Place 1 Application into the nose 2 (two) times daily for 60 doses. Use as directed 2 times daily for 5 days every other week for 6 weeks. (Patient not taking: Reported on 11/06/2023), Disp: 60 g, Rfl: 0  Past Medical History:  Diagnosis Date  . Acute nonintractable headache 01/21/2021  . Arthritis   . Back pain   . Close exposure to COVID-19 virus 01/21/2021  . Degeneration of lumbar intervertebral disc 02/27/2016  . Displacement of lumbar intervertebral disc without myelopathy 02/27/2016  . Hypertension   . Idiopathic scoliosis 02/27/2016  . Low back pain 02/27/2016  . Nail fungus   . 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  . S/P lumbar fusion 01/17/2020  . S/P lumbar spinal fusion 07/10/2016   Objective:  PHYSICAL EXAM:   BP 122/82   Pulse 79   Temp 98.2 F (36.8 C)   Ht 5' 7 (1.702 m)   Wt 163 lb 3.2 oz (74 kg)   SpO2 98%   BMI 25.56 kg/m  {Vitals History (Optional):23777}  GEN: Well nourished, well developed, in no acute distress  HEENT: normal external ears and nose - normal external auditory canals and TMS - hearing grossly normal - normal nasal mucosa and septum - Lips, Teeth and Gums - normal  Oropharynx - normal mucosa, palate, and posterior pharynx Neck: no JVD or masses - no thyromegaly Cardiac: RRR; no murmurs, rubs, or gallops,no edema - no significant varicosities Respiratory:  normal respiratory rate and pattern with no distress - normal breath sounds with no rales, rhonchi, wheezes or rubs GI: normal bowel sounds, no masses or tenderness MS: no deformity or atrophy  Skin: warm and dry, no rash  Neuro:  Alert and Oriented x 3, Strength and sensation are intact - CN II-Xii grossly intact Psych: euthymic mood, appropriate affect and demeanor  Assessment & Plan:  *** There are no diagnoses linked to this encounter.   Follow-up: No follow-ups on file.  An After Visit Summary was printed and  given to the patient.  CAMIE JONELLE NICHOLAUS DEVONNA [facility] [phone]

## 2023-11-06 NOTE — Progress Notes (Signed)
 Subjective:  Patient ID: Tammy [name], female    DOB: 01-17-1971  Age: 53 y.o. MRN: [id number]  Chief Complaint  Patient presents with   Medical Management of Chronic Issues      Pt presents for follow up of hypertension. The patient is tolerating the medication well without side effects. Compliance with treatment has been good; including taking medication as directed , maintains a healthy diet and regular exercise regimen , and following up as directed. She is currently on losartan  25mg  -  Denies chest pain/sob/edema  Pt with history of elevated lipids and CAD seen on CTA- she is currently taking crestor  40 mg qd and zetia 10mg  qd - due for labwork  Pt with history of anxiety - stable on ativan  which she uses as needed  Pt with history of chronic low back pain - she does follow with neurosurgeon.  She had surgery last month and had hardware removed from prior spine surgery - was noted she did have broken screw - She states overall she is feeling much better and uses her pain/muscle relaxants as needed  Last visit ferritin was elevated - due to repeat  Pt would like to try medication to help stop vaping - she has tried chantix in past and could not tolerate  Pt due to repeat ua - had blood noted at last visit Pt is asymptomatic  Pt would like flu and pneumonia vaccines today  Current Outpatient Medications on File Prior to Visit  Medication Sig Dispense Refill   aspirin EC 81 MG tablet Take 81 mg by mouth daily. Swallow whole.     ezetimibe (ZETIA) 10 MG tablet Take 10 mg by mouth daily.     lidocaine  (XYLOCAINE ) 2 % solution Use as directed 15 mLs in the mouth or throat every 6 (six) hours as needed for mouth pain. 100 mL 0   losartan  (COZAAR ) 25 MG tablet Take 1 tablet (25 mg total) by mouth daily. 90 tablet 0   meloxicam (MOBIC) 15 MG tablet Take 15 mg by mouth daily as needed for pain.     oxyCODONE  (OXY IR/ROXICODONE ) 5 MG immediate release tablet Take 1 tablet (5  mg total) by mouth every 4 (four) hours as needed (for pain score of 1-4). 30 tablet 0   rosuvastatin  (CRESTOR ) 40 MG tablet Take 40 mg by mouth daily.     gabapentin  (NEURONTIN ) 300 MG capsule Take 300 mg by mouth daily as needed (pain). (Patient not taking: Reported on 11/06/2023)     No current facility-administered medications on file prior to visit.   Past Medical History:  Diagnosis Date   Acute nonintractable headache 01/21/2021   Arthritis    Back pain    Close exposure to COVID-19 virus 01/21/2021   Degeneration of lumbar intervertebral disc 02/27/2016   Displacement of lumbar intervertebral disc without myelopathy 02/27/2016   Hypertension    Idiopathic scoliosis 02/27/2016   Low back pain 02/27/2016   Nail fungus    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   S/P lumbar fusion 01/17/2020   S/P lumbar spinal fusion 07/10/2016   Past Surgical History:  Procedure Laterality Date   ABDOMINAL HYSTERECTOMY     BACK SURGERY     BUNIONECTOMY     RIGHT FOOT   LAMINECTOMY WITH POSTERIOR LATERAL ARTHRODESIS LEVEL 1 N/A 10/15/2023   Procedure: POSTERIOR LATERAL FUSION LUMBAR FIVE-SACRAL ONE, REMOVAL OF SEQMENTAL FIXATION LUMBAR FOUR-SACRAL ONE;  Surgeon: Joshua Alm Hamilton, MD;  Location: [facility] OR;  Service: Neurosurgery;  Laterality:  N/A;  Posterior lateral fusion - L5-S1, removal of segmental fixation L4-S1   MAXIMUM ACCESS (MAS)POSTERIOR LUMBAR INTERBODY FUSION (PLIF) 3 LEVEL N/A 07/10/2016   Procedure: Lumbar two-Lumbar three - Lumbar three-Lumbar four - Lumbar four-Lumbar five MAXIMUM ACCESS (MAS) POSTERIOR LUMBAR INTERBODY FUSION;  Surgeon: Joshua Alm RAMAN, MD;  Location: [facility] OR;  Service: Neurosurgery;  Laterality: N/A;   SPINE SURGERY     TONSILLECTOMY     TUBAL LIGATION      Family History  Problem Relation Age of Onset   Stroke Mother    Hypertension Mother    Ovarian cancer Mother    Arthritis Mother    Varicose Veins Mother    Hypercholesterolemia Father    Heart murmur Father     Heart disease Father    Hypertension Sister    Hypercholesterolemia Sister    Stroke Sister    Social History   Socioeconomic History   Marital status: Married    Spouse name: Not on file   Number of children: Not on file   Years of education: Not on file   Highest education level: Some college, no degree  Occupational History   Not on file  Tobacco Use   Smoking status: Former    Current packs/day: 0.00    Average packs/day: 1 pack/day for 25.0 years (25.0 ttl pk-yrs)    Types: Cigarettes    Start date: 14    Quit date: 2018    Years since quitting: 7.7   Smokeless tobacco: Never  Vaping Use   Vaping status: Every Day   Substances: Nicotine  Substance and Sexual Activity   Alcohol use: Yes    Alcohol/week: 10.0 standard drinks of alcohol    Types: 10 Cans of beer per week    Comment: SOCIAL   Drug use: No   Sexual activity: Yes    Birth control/protection: Surgical  Other Topics Concern   Not on file  Social History Narrative   Not on file   Social Drivers of Health   Financial Resource Strain: Low Risk  (07/31/2023)   Received from [facility]   Overall Financial Resource Strain (CARDIA)    Difficulty of Paying Living Expenses: Not very hard  Food Insecurity: No Food Insecurity (07/31/2023)   Received from [facility]   Hunger Vital Sign    Within the past 12 months, you worried that your food would run out before you got the money to buy more.: Never true    Within the past 12 months, the food you bought just didn't last and you didn't have money to get more.: Never true  Transportation Needs: No Transportation Needs (07/31/2023)   Received from [facility] - Transportation    In the past 12 months, has lack of transportation kept you from medical appointments or from getting medications?: No    Lack of Transportation (Non-Medical): No  Physical Activity: Insufficiently Active (05/01/2023)    Exercise Vital Sign    Days of Exercise per Week: 2 days    Minutes of Exercise per Session: 30 min  Stress: No Stress Concern Present (05/01/2023)   [employer] of Occupational [employer] - Occupational Stress Questionnaire    Feeling of Stress : Not at all  Social Connections: Moderately Integrated (05/01/2023)   Social Connection and Isolation Panel    Frequency of Communication with Friends and Family: More than three times a week    Frequency of Social Gatherings with  Friends and Family: Once a week    Attends Religious Services: 1 to 4 times per year    Active Member of [employer] or Organizations: No    Attends Engineer, structural: Not on file    Marital Status: Married   CONSTITUTIONAL: Negative for chills, fatigue, fever, unintentional weight gain and unintentional weight loss.  E/N/T: Negative for ear pain, nasal congestion and sore throat.  CARDIOVASCULAR: Negative for chest pain, dizziness, palpitations and pedal edema.  RESPIRATORY: Negative for recent cough and dyspnea.  GASTROINTESTINAL: Negative for abdominal pain, acid reflux symptoms, constipation, diarrhea, nausea and vomiting.  MSK: see HPI INTEGUMENTARY: Negative for rash.  NEUROLOGICAL: Negative for dizziness and headaches.  PSYCHIATRIC: Negative for sleep disturbance and to question depression screen.  Negative for depression, negative for anhedonia.        Objective:  PHYSICAL EXAM:   VS: BP 122/82   Pulse 79   Temp 98.2 F (36.8 C)   Ht 5' 7 (1.702 m)   Wt 163 lb 3.2 oz (74 kg)   SpO2 98%   BMI 25.56 kg/m   GEN: Well nourished, well developed, in no acute distress   Cardiac: RRR; no murmurs, rubs, or gallops,no edema -  Respiratory:  normal respiratory rate and pattern with no distress - normal breath sounds with no rales, rhonchi, wheezes or rubs  MS: no deformity or atrophy  Skin: warm and dry, no rash  Neuro:  Alert and Oriented x 3,- CN II-Xii grossly intact Psych: euthymic mood,  appropriate affect and demeanor    Lab Results  Component Value Date   WBC 7.3 10/09/2023   HGB 13.5 10/09/2023   HCT 42.0 10/09/2023   PLT 239 10/09/2023   GLUCOSE 61 (L) 10/09/2023   CHOL 184 05/05/2023   TRIG 77 05/05/2023   HDL 75 05/05/2023   LDLCALC 95 05/05/2023   ALT 19 05/05/2023   AST 17 05/05/2023   NA 139 10/09/2023   K 4.0 10/09/2023   CL 100 10/09/2023   CREATININE 0.79 10/09/2023   BUN 16 10/09/2023   CO2 28 10/09/2023   TSH 1.010 05/05/2023   INR 0.9 01/13/2020      Assessment & Plan:   Problem List Items Addressed This Visit   None Visit Diagnoses     Benign hypertension    -  Primary   Relevant Medications   losartan  (COZAAR ) 25 MG tablet    Continue med Labwork pending   Anxiety       Relevant Medications   LORazepam  (ATIVAN ) 1 MG tablet Continue meds   Mixed hyperlipidemia  /CAD   Relevant Medications   losartan  (COZAAR ) 25 MG tablet   Crestor  40 mg qd Zetia 10mg  qd Watch diet     . Chronic low back pain Continue follow up with specialist as directed  Asymptomatic hematuria Ua and microscopy ordered  Elevated ferritin/abnormal labs Iron studies pending  Nicotine abuse Rx for effexor 37.5mg  qd  Immunizations due Flublok and pneumo 20 given Meds ordered this encounter  Medications   venlafaxine XR (EFFEXOR XR) 37.5 MG 24 hr capsule    Sig: Take 1 capsule (37.5 mg total) by mouth daily with breakfast.    Dispense:  30 capsule    Refill:  0    Supervising Provider:   COX, KIRSTEN [[id number]]   cyclobenzaprine  (FLEXERIL ) 10 MG tablet    Sig: TAKE ONE TABLET BY MOUTH 3 TIMES DAILY FOR MUSCLE SPASMS    Dispense:  90 tablet  Refill:  1    Supervising Provider:   COX, KIRSTEN [[id number]]   LORazepam  (ATIVAN ) 1 MG tablet    Sig: Take 1 tablet (1 mg total) by mouth at bedtime.    Dispense:  30 tablet    Refill:  1    Supervising Provider:   COX, KIRSTEN [phone]    Orders Placed This Encounter  Procedures   Flu vaccine,  recombinant, trivalent, inj   Pneumococcal conjugate vaccine 20-valent (Prevnar 20)   Fe+CBC/D/Plt+TIBC+Fer+Retic   Comprehensive metabolic panel with GFR   Lipid panel   TSH   Hemoglobin A1c   VITAMIN D  25 Hydroxy (Vit-D Deficiency, Fractures)   Urinalysis, Routine w reflex microscopic   POCT URINALYSIS DIP (CLINITEK)      Follow-up: Return in about 6 months (around 05/06/2024) for chronic fasting follow-up.  An After Visit Summary was printed and given to the patient.  CAMIE JONELLE NICHOLAUS DEVONNA [facility] [phone]

## 2023-11-07 ENCOUNTER — Other Ambulatory Visit: Payer: Self-pay | Admitting: Physician Assistant

## 2023-11-07 ENCOUNTER — Ambulatory Visit: Payer: Self-pay | Admitting: Physician Assistant

## 2023-11-07 DIAGNOSIS — R7989 Other specified abnormal findings of blood chemistry: Secondary | ICD-10-CM

## 2023-11-07 LAB — COMPREHENSIVE METABOLIC PANEL WITH GFR
ALT: 36 IU/L — ABNORMAL HIGH (ref 0–32)
AST: 30 IU/L (ref 0–40)
Albumin: 4.6 g/dL (ref 3.8–4.9)
Alkaline Phosphatase: 79 IU/L (ref 49–135)
BUN/Creatinine Ratio: 17 (ref 9–23)
BUN: 14 mg/dL (ref 6–24)
Bilirubin Total: 0.4 mg/dL (ref 0.0–1.2)
CO2: 24 mmol/L (ref 20–29)
Calcium: 9.8 mg/dL (ref 8.7–10.2)
Chloride: 103 mmol/L (ref 96–106)
Creatinine, Ser: 0.81 mg/dL (ref 0.57–1.00)
Globulin, Total: 2.2 g/dL (ref 1.5–4.5)
Glucose: 106 mg/dL — ABNORMAL HIGH (ref 70–99)
Potassium: 4.7 mmol/L (ref 3.5–5.2)
Sodium: 142 mmol/L (ref 134–144)
Total Protein: 6.8 g/dL (ref 6.0–8.5)
eGFR: 86 mL/min/1.73 (ref >59)

## 2023-11-07 LAB — LIPID PANEL
Chol/HDL Ratio: 2.2 ratio (ref 0.0–4.4)
Cholesterol, Total: 151 mg/dL (ref 100–199)
HDL: 68 mg/dL (ref >39)
LDL Chol Calc (NIH): 70 mg/dL (ref 0–99)
Triglycerides: 62 mg/dL (ref 0–149)
VLDL Cholesterol Cal: 13 mg/dL (ref 5–40)

## 2023-11-07 LAB — FE+CBC/D/PLT+TIBC+FER+RETIC
Basophils Absolute: 0 x10E3/uL (ref 0.0–0.2)
Basos: 1 %
EOS (ABSOLUTE): 0.1 x10E3/uL (ref 0.0–0.4)
Eos: 1 %
Ferritin: 319 ng/mL — ABNORMAL HIGH (ref 15–150)
Hematocrit: 41 % (ref 34.0–46.6)
Hemoglobin: 13.1 g/dL (ref 11.1–15.9)
Immature Grans (Abs): 0 x10E3/uL (ref 0.0–0.1)
Immature Granulocytes: 0 %
Iron Saturation: 30 % (ref 15–55)
Iron: 96 ug/dL (ref 27–159)
Lymphocytes Absolute: 2.3 x10E3/uL (ref 0.7–3.1)
Lymphs: 44 %
MCH: 30.5 pg (ref 26.6–33.0)
MCHC: 32 g/dL (ref 31.5–35.7)
MCV: 95 fL (ref 79–97)
Monocytes Absolute: 0.4 x10E3/uL (ref 0.1–0.9)
Monocytes: 8 %
Neutrophils Absolute: 2.4 x10E3/uL (ref 1.4–7.0)
Neutrophils: 46 %
Platelets: 336 x10E3/uL (ref 150–450)
RBC: 4.3 x10E6/uL (ref 3.77–5.28)
RDW: 12.9 % (ref 11.7–15.4)
Retic Ct Pct: 1.4 % (ref 0.6–2.6)
Total Iron Binding Capacity: 317 ug/dL (ref 250–450)
UIBC: 221 ug/dL (ref 131–425)
WBC: 5.2 x10E3/uL (ref 3.4–10.8)

## 2023-11-07 LAB — HEMOGLOBIN A1C
Est. average glucose Bld gHb Est-mCnc: 120 mg/dL
Hgb A1c MFr Bld: 5.8 % — ABNORMAL HIGH (ref 4.8–5.6)

## 2023-11-07 LAB — TSH: TSH: 0.608 u[IU]/mL (ref 0.450–4.500)

## 2023-11-07 LAB — VITAMIN D 25 HYDROXY (VIT D DEFICIENCY, FRACTURES): Vit D, 25-Hydroxy: 38.9 ng/mL (ref 30.0–100.0)

## 2023-11-10 NOTE — Telephone Encounter (Signed)
 Called and discussed lab work with patient.

## 2023-12-04 ENCOUNTER — Encounter: Payer: Self-pay | Admitting: Physician Assistant

## 2023-12-08 ENCOUNTER — Other Ambulatory Visit: Payer: Self-pay | Admitting: Physician Assistant

## 2023-12-08 MED ORDER — VARENICLINE TARTRATE (STARTER) 0.5 MG X 11 & 1 MG X 42 PO TBPK: ORAL_TABLET | ORAL | 0 refills | Status: AC

## 2024-01-06 ENCOUNTER — Other Ambulatory Visit: Payer: Self-pay | Admitting: Physician Assistant

## 2024-01-06 MED ORDER — VARENICLINE TARTRATE 0.5 MG PO TABS: 0.5000 mg | ORAL_TABLET | Freq: Two times a day (BID) | ORAL | 1 refills | Status: AC

## 2024-01-08 ENCOUNTER — Other Ambulatory Visit

## 2024-01-08 DIAGNOSIS — R7989 Other specified abnormal findings of blood chemistry: Secondary | ICD-10-CM

## 2024-01-08 DIAGNOSIS — Z01419 Encounter for gynecological examination (general) (routine) without abnormal findings: Secondary | ICD-10-CM | POA: Diagnosis not present

## 2024-01-08 DIAGNOSIS — N951 Menopausal and female climacteric states: Secondary | ICD-10-CM | POA: Diagnosis not present

## 2024-01-09 LAB — FE+CBC/D/PLT+TIBC+FER+RETIC
Basophils Absolute: 0 x10E3/uL (ref 0.0–0.2)
Basos: 1 %
EOS (ABSOLUTE): 0 x10E3/uL (ref 0.0–0.4)
Eos: 1 %
Ferritin: 185 ng/mL — ABNORMAL HIGH (ref 15–150)
Hematocrit: 43.9 % (ref 34.0–46.6)
Hemoglobin: 14.3 g/dL (ref 11.1–15.9)
Immature Grans (Abs): 0 x10E3/uL (ref 0.0–0.1)
Immature Granulocytes: 0 %
Iron Saturation: 31 % (ref 15–55)
Iron: 95 ug/dL (ref 27–159)
Lymphocytes Absolute: 2.4 x10E3/uL (ref 0.7–3.1)
Lymphs: 50 %
MCH: 30.9 pg (ref 26.6–33.0)
MCHC: 32.6 g/dL (ref 31.5–35.7)
MCV: 95 fL (ref 79–97)
Monocytes Absolute: 0.4 x10E3/uL (ref 0.1–0.9)
Monocytes: 9 %
Neutrophils Absolute: 1.9 x10E3/uL (ref 1.4–7.0)
Neutrophils: 39 %
Platelets: 246 x10E3/uL (ref 150–450)
RBC: 4.63 x10E6/uL (ref 3.77–5.28)
RDW: 12.5 % (ref 11.7–15.4)
Retic Ct Pct: 0.9 % (ref 0.6–2.6)
Total Iron Binding Capacity: 306 ug/dL (ref 250–450)
UIBC: 211 ug/dL (ref 131–425)
WBC: 4.7 x10E3/uL (ref 3.4–10.8)

## 2024-01-10 ENCOUNTER — Ambulatory Visit: Payer: Self-pay | Admitting: Physician Assistant

## 2024-02-06 ENCOUNTER — Other Ambulatory Visit: Payer: Self-pay | Admitting: Physician Assistant

## 2024-05-07 ENCOUNTER — Ambulatory Visit: Admitting: Physician Assistant

## 2024-05-21 ENCOUNTER — Ambulatory Visit: Admitting: Physician Assistant
